# Patient Record
Sex: Female | Born: 1950 | Race: White | Hispanic: No | Marital: Married | Smoking: Former smoker
Health system: Southern US, Community
[De-identification: ages and names within clinical notes are randomized; demographics above are authoritative.]

## PROBLEM LIST (undated history)

## (undated) DIAGNOSIS — K219 Gastro-esophageal reflux disease without esophagitis: Secondary | ICD-10-CM

## (undated) DIAGNOSIS — M8580 Other specified disorders of bone density and structure, unspecified site: Secondary | ICD-10-CM

## (undated) DIAGNOSIS — I10 Essential (primary) hypertension: Secondary | ICD-10-CM

## (undated) DIAGNOSIS — R319 Hematuria, unspecified: Secondary | ICD-10-CM

## (undated) DIAGNOSIS — N809 Endometriosis, unspecified: Secondary | ICD-10-CM

## (undated) DIAGNOSIS — R569 Unspecified convulsions: Secondary | ICD-10-CM

## (undated) DIAGNOSIS — G510 Bell's palsy: Secondary | ICD-10-CM

## (undated) DIAGNOSIS — N840 Polyp of corpus uteri: Secondary | ICD-10-CM

## (undated) DIAGNOSIS — D1771 Benign lipomatous neoplasm of kidney: Secondary | ICD-10-CM

## (undated) DIAGNOSIS — D649 Anemia, unspecified: Secondary | ICD-10-CM

## (undated) HISTORY — PX: BREAST CYST EXCISION: SHX579

## (undated) HISTORY — PX: DILATION AND CURETTAGE OF UTERUS: SHX78

## (undated) HISTORY — PX: APPENDECTOMY: SHX54

## (undated) HISTORY — DX: Essential (primary) hypertension: I10

## (undated) HISTORY — PX: UTERINE FIBROID SURGERY: SHX826

## (undated) HISTORY — DX: Benign lipomatous neoplasm of kidney: D17.71

## (undated) HISTORY — DX: Other specified disorders of bone density and structure, unspecified site: M85.80

## (undated) HISTORY — PX: NOSE SURGERY: SHX723

## (undated) HISTORY — PX: BREAST SURGERY: SHX581

---

## 1998-07-16 ENCOUNTER — Other Ambulatory Visit: Admission: RE | Admit: 1998-07-16 | Discharge: 1998-07-16 | Payer: Self-pay | Admitting: Obstetrics and Gynecology

## 1998-09-22 ENCOUNTER — Ambulatory Visit (HOSPITAL_COMMUNITY): Admission: RE | Admit: 1998-09-22 | Discharge: 1998-09-22 | Payer: Self-pay | Admitting: *Deleted

## 2000-02-24 ENCOUNTER — Other Ambulatory Visit: Admission: RE | Admit: 2000-02-24 | Discharge: 2000-02-24 | Payer: Self-pay | Admitting: Obstetrics and Gynecology

## 2000-05-20 ENCOUNTER — Ambulatory Visit (HOSPITAL_COMMUNITY): Admission: RE | Admit: 2000-05-20 | Discharge: 2000-05-20 | Payer: Self-pay | Admitting: *Deleted

## 2002-08-09 ENCOUNTER — Ambulatory Visit: Admission: RE | Admit: 2002-08-09 | Discharge: 2002-08-09 | Payer: Self-pay | Admitting: Family Medicine

## 2002-08-09 ENCOUNTER — Encounter: Payer: Self-pay | Admitting: Family Medicine

## 2005-03-02 ENCOUNTER — Other Ambulatory Visit: Admission: RE | Admit: 2005-03-02 | Discharge: 2005-03-02 | Payer: Self-pay | Admitting: Obstetrics and Gynecology

## 2006-11-10 ENCOUNTER — Ambulatory Visit (HOSPITAL_COMMUNITY): Admission: RE | Admit: 2006-11-10 | Discharge: 2006-11-10 | Payer: Self-pay | Admitting: Obstetrics and Gynecology

## 2010-06-19 LAB — HM MAMMOGRAPHY: HM Mammogram: NEGATIVE

## 2013-09-13 ENCOUNTER — Encounter (HOSPITAL_BASED_OUTPATIENT_CLINIC_OR_DEPARTMENT_OTHER): Payer: Self-pay | Admitting: Emergency Medicine

## 2013-09-13 ENCOUNTER — Emergency Department (HOSPITAL_BASED_OUTPATIENT_CLINIC_OR_DEPARTMENT_OTHER): Payer: BC Managed Care – PPO

## 2013-09-13 ENCOUNTER — Emergency Department (HOSPITAL_BASED_OUTPATIENT_CLINIC_OR_DEPARTMENT_OTHER)
Admission: EM | Admit: 2013-09-13 | Discharge: 2013-09-13 | Disposition: A | Discharge status: Home / Self Care | Payer: BC Managed Care – PPO | Attending: Emergency Medicine | Admitting: Emergency Medicine

## 2013-09-13 DIAGNOSIS — Z8742 Personal history of other diseases of the female genital tract: Secondary | ICD-10-CM | POA: Insufficient documentation

## 2013-09-13 DIAGNOSIS — S0081XA Abrasion of other part of head, initial encounter: Secondary | ICD-10-CM

## 2013-09-13 DIAGNOSIS — S92102A Unspecified fracture of left talus, initial encounter for closed fracture: Secondary | ICD-10-CM

## 2013-09-13 DIAGNOSIS — S82899A Other fracture of unspecified lower leg, initial encounter for closed fracture: Secondary | ICD-10-CM | POA: Diagnosis not present

## 2013-09-13 DIAGNOSIS — Y9389 Activity, other specified: Secondary | ICD-10-CM | POA: Diagnosis not present

## 2013-09-13 DIAGNOSIS — IMO0002 Reserved for concepts with insufficient information to code with codable children: Secondary | ICD-10-CM | POA: Diagnosis not present

## 2013-09-13 DIAGNOSIS — S8001XA Contusion of right knee, initial encounter: Secondary | ICD-10-CM

## 2013-09-13 DIAGNOSIS — S8002XA Contusion of left knee, initial encounter: Secondary | ICD-10-CM

## 2013-09-13 DIAGNOSIS — Z87891 Personal history of nicotine dependence: Secondary | ICD-10-CM | POA: Diagnosis not present

## 2013-09-13 DIAGNOSIS — Y92410 Unspecified street and highway as the place of occurrence of the external cause: Secondary | ICD-10-CM | POA: Insufficient documentation

## 2013-09-13 DIAGNOSIS — S8000XA Contusion of unspecified knee, initial encounter: Secondary | ICD-10-CM | POA: Insufficient documentation

## 2013-09-13 DIAGNOSIS — S298XXA Other specified injuries of thorax, initial encounter: Secondary | ICD-10-CM | POA: Insufficient documentation

## 2013-09-13 DIAGNOSIS — S8990XA Unspecified injury of unspecified lower leg, initial encounter: Secondary | ICD-10-CM | POA: Diagnosis present

## 2013-09-13 DIAGNOSIS — S0003XA Contusion of scalp, initial encounter: Secondary | ICD-10-CM | POA: Insufficient documentation

## 2013-09-13 DIAGNOSIS — S0083XA Contusion of other part of head, initial encounter: Secondary | ICD-10-CM

## 2013-09-13 DIAGNOSIS — S82891A Other fracture of right lower leg, initial encounter for closed fracture: Secondary | ICD-10-CM

## 2013-09-13 HISTORY — DX: Endometriosis, unspecified: N80.9

## 2013-09-13 HISTORY — DX: Polyp of corpus uteri: N84.0

## 2013-09-13 MED ORDER — HYDROCODONE-ACETAMINOPHEN 5-325 MG PO TABS: 2.0000 | ORAL_TABLET | ORAL | Status: DC | PRN

## 2013-09-13 MED ORDER — IBUPROFEN 800 MG PO TABS: 800.0000 mg | ORAL_TABLET | Freq: Three times a day (TID) | ORAL | Status: DC

## 2013-09-13 MED ORDER — IBUPROFEN 800 MG PO TABS
800.0000 mg | ORAL_TABLET | Freq: Once | ORAL | Status: AC
  Administered 2013-09-13: 800 mg via ORAL
  Filled 2013-09-13: qty 1
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
## 2013-09-14 NOTE — ED Provider Notes (Signed)
Medical screening examination/treatment/procedure(s) were performed by non-physician practitioner and as supervising physician I was immediately available for consultation/collaboration.  EKG Interpretation   None         Ziyana Morikawa T Nyellie Yetter, MD 09/14/13 0058 

## 2014-06-13 ENCOUNTER — Other Ambulatory Visit: Payer: Self-pay | Admitting: Urology

## 2014-07-08 ENCOUNTER — Encounter (HOSPITAL_COMMUNITY): Payer: Self-pay | Admitting: Pharmacy Technician

## 2014-07-10 NOTE — Patient Instructions (Addendum)
Denise Lin  07/10/2014   Your procedure is scheduled on:  07/19/2014    Report to [facility].  Follow the Signs to [facility] at 1100       am  Call this number if you have problems the morning of surgery: [phone]   Remember: Magnesium Citrate - 1 bottle - Take 8-10 ounces by 12 noon day before procedure.    Do not eat food or drink liquids after midnight.   Take these medicines the morning of surgery with A SIP OF WATER: none   Do not wear jewelry, make-up or nail polish.  Do not wear lotions, powders, or perfumes. , deodorant.    Do not shave 48 hours prior to surgery.   Do not bring valuables to the hospital.  Contacts, dentures or bridgework may not be worn into surgery.  Leave suitcase in the car. After surgery it may be brought to your room.  For patients admitted to the hospital, checkout time is 11:00 AM the day of  discharge.        Please read over the following fact sheets that you were given: [facility] - Preparing for Surgery Before surgery, you can play an important role.  Because skin is not sterile, your skin needs to be as free of germs as possible.  You can reduce the number of germs on your skin by washing with CHG (chlorahexidine gluconate) soap before surgery.  CHG is an antiseptic cleaner which kills germs and bonds with the skin to continue killing germs even after washing. Please DO NOT use if you have an allergy to CHG or antibacterial soaps.  If your skin becomes reddened/irritated stop using the CHG and inform your nurse when you arrive at Short Stay. Do not shave (including legs and underarms) for at least 48 hours prior to the first CHG shower.  You may shave your face/neck. Please follow these instructions carefully:  1.  Shower with CHG Soap the night before surgery and the  morning of Surgery.  2.  If you choose to wash your hair, wash your hair first as usual with your  normal  shampoo.  3.  After you shampoo, rinse your hair and  body thoroughly to remove the  shampoo.                           4.  Use CHG as you would any other liquid soap.  You can apply chg directly  to the skin and wash                       Gently with a scrungie or clean washcloth.  5.  Apply the CHG Soap to your body ONLY FROM THE NECK DOWN.   Do not use on face/ open                           Wound or open sores. Avoid contact with eyes, ears mouth and genitals (private parts).                       Wash face,  Genitals (private parts) with your normal soap.             6.  Wash thoroughly, paying special attention to the area where your surgery  will be performed.  7.  Thoroughly rinse your body  with warm water from the neck down.  8.  DO NOT shower/wash with your normal soap after using and rinsing off  the CHG Soap.                9.  Pat yourself dry with a clean towel.            10.  Wear clean pajamas.            11.  Place clean sheets on your bed the night of your first shower and do not  sleep with pets. Day of Surgery : Do not apply any lotions/deodorants the morning of surgery.  Please wear clean clothes to the hospital/surgery center.  FAILURE TO FOLLOW THESE INSTRUCTIONS MAY RESULT IN THE CANCELLATION OF YOUR SURGERY PATIENT SIGNATURE_________________________________  NURSE SIGNATURE__________________________________  ________________________________________________________________________  WHAT IS A BLOOD TRANSFUSION? Blood Transfusion Information  A transfusion is the replacement of blood or some of its parts. Blood is made up of multiple cells which provide different functions.  Red blood cells carry oxygen and are used for blood loss replacement.  White blood cells fight against infection.  Platelets control bleeding.  Plasma helps clot blood.  Other blood products are available for specialized needs, such as hemophilia or other clotting disorders. BEFORE THE TRANSFUSION  Who gives blood for transfusions?    Healthy volunteers who are fully evaluated to make sure their blood is safe. This is blood bank blood. Transfusion therapy is the safest it has ever been in the practice of medicine. Before blood is taken from a donor, a complete history is taken to make sure that person has no history of diseases nor engages in risky social behavior (examples are intravenous drug use or sexual activity with multiple partners). The donor's travel history is screened to minimize risk of transmitting infections, such as malaria. The donated blood is tested for signs of infectious diseases, such as HIV and hepatitis. The blood is then tested to be sure it is compatible with you in order to minimize the chance of a transfusion reaction. If you or a relative donates blood, this is often done in anticipation of surgery and is not appropriate for emergency situations. It takes many days to process the donated blood. RISKS AND COMPLICATIONS Although transfusion therapy is very safe and saves many lives, the main dangers of transfusion include:   Getting an infectious disease.  Developing a transfusion reaction. This is an allergic reaction to something in the blood you were given. Every precaution is taken to prevent this. The decision to have a blood transfusion has been considered carefully by your caregiver before blood is given. Blood is not given unless the benefits outweigh the risks. AFTER THE TRANSFUSION  Right after receiving a blood transfusion, you will usually feel much better and more energetic. This is especially true if your red blood cells have gotten low (anemic). The transfusion raises the level of the red blood cells which carry oxygen, and this usually causes an energy increase.  The nurse administering the transfusion will monitor you carefully for complications. HOME CARE INSTRUCTIONS  No special instructions are needed after a transfusion. You may find your energy is better. Speak with your  caregiver about any limitations on activity for underlying diseases you may have. SEEK MEDICAL CARE IF:   Your condition is not improving after your transfusion.  You develop redness or irritation at the intravenous (IV) site. SEEK IMMEDIATE MEDICAL CARE IF:  Any of the following symptoms occur  over the next 12 hours:  Shaking chills.  You have a temperature by mouth above 102 F (38.9 C), not controlled by medicine.  Chest, back, or muscle pain.  People around you feel you are not acting correctly or are confused.  Shortness of breath or difficulty breathing.  Dizziness and fainting.  You get a rash or develop hives.  You have a decrease in urine output.  Your urine turns a dark color or changes to pink, red, or brown. Any of the following symptoms occur over the next 10 days:  You have a temperature by mouth above 102 F (38.9 C), not controlled by medicine.  Shortness of breath.  Weakness after normal activity.  The white part of the eye turns yellow (jaundice).  You have a decrease in the amount of urine or are urinating less often.  Your urine turns a dark color or changes to pink, red, or brown. Document Released: 10/22/2000 Document Revised: 01/17/2012 Document Reviewed: 06/10/2008 ExitCare Patient Information 2014 [place].  _______________________________________________________________________, coughing and deep breathing exercises, leg exercises

## 2014-07-11 ENCOUNTER — Encounter (INDEPENDENT_AMBULATORY_CARE_PROVIDER_SITE_OTHER): Payer: Self-pay

## 2014-07-11 ENCOUNTER — Encounter (HOSPITAL_COMMUNITY)
Admission: RE | Admit: 2014-07-11 | Discharge: 2014-07-11 | Disposition: A | Discharge status: Home / Self Care | Payer: BC Managed Care – PPO | Source: Ambulatory Visit | Attending: Urology | Admitting: Urology

## 2014-07-11 ENCOUNTER — Encounter (HOSPITAL_COMMUNITY): Payer: Self-pay

## 2014-07-11 DIAGNOSIS — Z01818 Encounter for other preprocedural examination: Secondary | ICD-10-CM | POA: Insufficient documentation

## 2014-07-11 DIAGNOSIS — N289 Disorder of kidney and ureter, unspecified: Secondary | ICD-10-CM | POA: Insufficient documentation

## 2014-07-11 HISTORY — DX: Unspecified convulsions: R56.9

## 2014-07-11 HISTORY — DX: Hematuria, unspecified: R31.9

## 2014-07-11 HISTORY — DX: Anemia, unspecified: D64.9

## 2014-07-11 HISTORY — DX: Bell's palsy: G51.0

## 2014-07-11 HISTORY — DX: Gastro-esophageal reflux disease without esophagitis: K21.9

## 2014-07-11 LAB — BASIC METABOLIC PANEL
ANION GAP: 13 (ref 5–15)
BUN: 13 mg/dL (ref 6–23)
CALCIUM: 10.1 mg/dL (ref 8.4–10.5)
CO2: 27 mEq/L (ref 19–32)
Chloride: 103 mEq/L (ref 96–112)
Creatinine, Ser: 0.6 mg/dL (ref 0.50–1.10)
GFR calc Af Amer: >90 mL/min (ref >90)
GFR calc non Af Amer: >90 mL/min (ref >90)
Glucose, Bld: 101 mg/dL — ABNORMAL HIGH (ref 70–99)
Potassium: 4.7 mEq/L (ref 3.7–5.3)
Sodium: 143 mEq/L (ref 137–147)

## 2014-07-11 LAB — CBC
HCT: 40.2 % (ref 36.0–46.0)
Hemoglobin: 13.1 g/dL (ref 12.0–15.0)
MCH: 28.4 pg (ref 26.0–34.0)
MCHC: 32.6 g/dL (ref 30.0–36.0)
MCV: 87.2 fL (ref 78.0–100.0)
PLATELETS: 291 10*3/uL (ref 150–400)
RBC: 4.61 MIL/uL (ref 3.87–5.11)
RDW: 13.7 % (ref 11.5–15.5)
WBC: 6.3 10*3/uL (ref 4.0–10.5)

## 2014-07-11 LAB — ABO/RH: ABO/RH(D): A POS

## 2014-07-11 NOTE — Progress Notes (Signed)
Last office visit with Dr Jerilynn Birkenhead- 02/26/14 on chart

## 2014-07-11 NOTE — Progress Notes (Signed)
Patient states she was aware of Magnesium Citrate as preop prep.

## 2014-07-19 ENCOUNTER — Encounter (HOSPITAL_COMMUNITY)
Admission: RE | Disposition: A | Discharge status: Home / Self Care | Payer: Self-pay | Source: Ambulatory Visit | Attending: Urology

## 2014-07-19 ENCOUNTER — Encounter (HOSPITAL_COMMUNITY): Payer: BC Managed Care – PPO | Admitting: Registered Nurse

## 2014-07-19 ENCOUNTER — Inpatient Hospital Stay (HOSPITAL_COMMUNITY): Payer: BC Managed Care – PPO | Admitting: Registered Nurse

## 2014-07-19 ENCOUNTER — Encounter (HOSPITAL_COMMUNITY): Payer: Self-pay | Admitting: Registered Nurse

## 2014-07-19 ENCOUNTER — Inpatient Hospital Stay (HOSPITAL_COMMUNITY)
Admission: RE | Admit: 2014-07-19 | Discharge: 2014-07-21 | DRG: 658 | Disposition: A | Discharge status: Home / Self Care | Payer: BC Managed Care – PPO | Source: Ambulatory Visit | Attending: Urology | Admitting: Urology

## 2014-07-19 DIAGNOSIS — N289 Disorder of kidney and ureter, unspecified: Secondary | ICD-10-CM | POA: Diagnosis present

## 2014-07-19 DIAGNOSIS — K5730 Diverticulosis of large intestine without perforation or abscess without bleeding: Secondary | ICD-10-CM | POA: Diagnosis present

## 2014-07-19 DIAGNOSIS — K219 Gastro-esophageal reflux disease without esophagitis: Secondary | ICD-10-CM | POA: Diagnosis present

## 2014-07-19 DIAGNOSIS — Z01812 Encounter for preprocedural laboratory examination: Secondary | ICD-10-CM | POA: Diagnosis not present

## 2014-07-19 DIAGNOSIS — K660 Peritoneal adhesions (postprocedural) (postinfection): Secondary | ICD-10-CM | POA: Diagnosis present

## 2014-07-19 DIAGNOSIS — Z87891 Personal history of nicotine dependence: Secondary | ICD-10-CM

## 2014-07-19 DIAGNOSIS — N2889 Other specified disorders of kidney and ureter: Secondary | ICD-10-CM | POA: Diagnosis present

## 2014-07-19 DIAGNOSIS — D3000 Benign neoplasm of unspecified kidney: Principal | ICD-10-CM | POA: Diagnosis present

## 2014-07-19 DIAGNOSIS — G43909 Migraine, unspecified, not intractable, without status migrainosus: Secondary | ICD-10-CM | POA: Diagnosis present

## 2014-07-19 HISTORY — PX: ROBOTIC ASSITED PARTIAL NEPHRECTOMY: SHX6087

## 2014-07-19 LAB — TYPE AND SCREEN
ABO/RH(D): A POS
Antibody Screen: NEGATIVE

## 2014-07-19 LAB — HEMOGLOBIN AND HEMATOCRIT, BLOOD
HCT: 35.9 % — ABNORMAL LOW (ref 36.0–46.0)
HEMOGLOBIN: 11.9 g/dL — AB (ref 12.0–15.0)

## 2014-07-19 SURGERY — ROBOTIC ASSITED PARTIAL NEPHRECTOMY
Anesthesia: General | Laterality: Left

## 2014-07-19 MED ORDER — GLYCOPYRROLATE 0.2 MG/ML IJ SOLN
INTRAMUSCULAR | Status: DC | PRN
  Administered 2014-07-19: .6 mg via INTRAVENOUS

## 2014-07-19 MED ORDER — LACTATED RINGERS IV SOLN
INTRAVENOUS | Status: DC
  Administered 2014-07-20: 02:00:00 via INTRAVENOUS

## 2014-07-19 MED ORDER — MIDAZOLAM HCL 2 MG/2ML IJ SOLN
INTRAMUSCULAR | Status: AC
  Filled 2014-07-19: qty 2

## 2014-07-19 MED ORDER — LIDOCAINE HCL (CARDIAC) 20 MG/ML IV SOLN
INTRAVENOUS | Status: DC | PRN
  Administered 2014-07-19: 25 mg via INTRATRACHEAL
  Administered 2014-07-19: 75 mg via INTRAVENOUS

## 2014-07-19 MED ORDER — LABETALOL HCL 5 MG/ML IV SOLN
INTRAVENOUS | Status: DC | PRN
  Administered 2014-07-19: 5 mg via INTRAVENOUS
  Administered 2014-07-19: 2.5 mg via INTRAVENOUS

## 2014-07-19 MED ORDER — PROPOFOL 10 MG/ML IV BOLUS
INTRAVENOUS | Status: DC | PRN
  Administered 2014-07-19 (×2): 30 mg via INTRAVENOUS
  Administered 2014-07-19: 180 mg via INTRAVENOUS

## 2014-07-19 MED ORDER — ACETAMINOPHEN 10 MG/ML IV SOLN
1000.0000 mg | Freq: Once | INTRAVENOUS | Status: AC
  Administered 2014-07-19: 1000 mg via INTRAVENOUS
  Filled 2014-07-19: qty 100

## 2014-07-19 MED ORDER — HYDROMORPHONE HCL PF 2 MG/ML IJ SOLN
INTRAMUSCULAR | Status: AC
  Filled 2014-07-19: qty 1

## 2014-07-19 MED ORDER — MANNITOL 25 % IV SOLN
INTRAVENOUS | Status: DC | PRN
  Administered 2014-07-19 (×2): 12.5 g via INTRAVENOUS

## 2014-07-19 MED ORDER — BUPIVACAINE LIPOSOME 1.3 % IJ SUSP
20.0000 mL | Freq: Once | INTRAMUSCULAR | Status: AC
Start: 2014-07-19 — End: 2014-07-19
  Administered 2014-07-19: 20 mL
  Filled 2014-07-19: qty 20

## 2014-07-19 MED ORDER — GLYCOPYRROLATE 0.2 MG/ML IJ SOLN
INTRAMUSCULAR | Status: AC
  Filled 2014-07-19: qty 3

## 2014-07-19 MED ORDER — NEOSTIGMINE METHYLSULFATE 10 MG/10ML IV SOLN
INTRAVENOUS | Status: AC
  Filled 2014-07-19: qty 1

## 2014-07-19 MED ORDER — SODIUM CHLORIDE 0.9 % IJ SOLN
INTRAMUSCULAR | Status: AC
  Filled 2014-07-19: qty 10

## 2014-07-19 MED ORDER — ACETAMINOPHEN 500 MG PO TABS
1000.0000 mg | ORAL_TABLET | Freq: Four times a day (QID) | ORAL | Status: AC
  Administered 2014-07-19 – 2014-07-20 (×3): 1000 mg via ORAL
  Filled 2014-07-19 (×3): qty 2

## 2014-07-19 MED ORDER — SUFENTANIL CITRATE 50 MCG/ML IV SOLN
INTRAVENOUS | Status: AC
  Filled 2014-07-19: qty 1

## 2014-07-19 MED ORDER — ROCURONIUM BROMIDE 100 MG/10ML IV SOLN
INTRAVENOUS | Status: AC
  Filled 2014-07-19: qty 1

## 2014-07-19 MED ORDER — LACTATED RINGERS IV SOLN
INTRAVENOUS | Status: DC
  Administered 2014-07-19: 15:00:00 via INTRAVENOUS
  Administered 2014-07-19: 1000 mL via INTRAVENOUS

## 2014-07-19 MED ORDER — DEXAMETHASONE SODIUM PHOSPHATE 10 MG/ML IJ SOLN
INTRAMUSCULAR | Status: AC
  Filled 2014-07-19: qty 1

## 2014-07-19 MED ORDER — ROCURONIUM BROMIDE 100 MG/10ML IV SOLN
INTRAVENOUS | Status: DC | PRN
  Administered 2014-07-19: 60 mg via INTRAVENOUS
  Administered 2014-07-19: 10 mg via INTRAVENOUS

## 2014-07-19 MED ORDER — CEFAZOLIN SODIUM-DEXTROSE 2-3 GM-% IV SOLR
2.0000 g | INTRAVENOUS | Status: AC
  Administered 2014-07-19: 2 g via INTRAVENOUS

## 2014-07-19 MED ORDER — HYDROMORPHONE HCL PF 1 MG/ML IJ SOLN: 0.5000 mg | INTRAMUSCULAR | Status: DC | PRN

## 2014-07-19 MED ORDER — HYDROCODONE-ACETAMINOPHEN 5-325 MG PO TABS: 1.0000 | ORAL_TABLET | Freq: Four times a day (QID) | ORAL | Status: DC | PRN

## 2014-07-19 MED ORDER — DEXAMETHASONE SODIUM PHOSPHATE 10 MG/ML IJ SOLN
INTRAMUSCULAR | Status: DC | PRN
  Administered 2014-07-19: 10 mg via INTRAVENOUS

## 2014-07-19 MED ORDER — LIDOCAINE HCL (CARDIAC) 20 MG/ML IV SOLN
INTRAVENOUS | Status: AC
  Filled 2014-07-19: qty 5

## 2014-07-19 MED ORDER — ONDANSETRON HCL 4 MG/2ML IJ SOLN
INTRAMUSCULAR | Status: AC
  Filled 2014-07-19: qty 2

## 2014-07-19 MED ORDER — MANNITOL 25 % IV SOLN
25.0000 g | Freq: Once | INTRAVENOUS | Status: DC
  Filled 2014-07-19 (×2): qty 100

## 2014-07-19 MED ORDER — MIDAZOLAM HCL 5 MG/5ML IJ SOLN
INTRAMUSCULAR | Status: DC | PRN
  Administered 2014-07-19: 2 mg via INTRAVENOUS

## 2014-07-19 MED ORDER — SODIUM CHLORIDE 0.9 % IJ SOLN
INTRAMUSCULAR | Status: AC
  Filled 2014-07-19: qty 20

## 2014-07-19 MED ORDER — ONDANSETRON HCL 4 MG/2ML IJ SOLN
INTRAMUSCULAR | Status: DC | PRN
  Administered 2014-07-19: 4 mg via INTRAVENOUS

## 2014-07-19 MED ORDER — SCOPOLAMINE 1 MG/3DAYS TD PT72
MEDICATED_PATCH | TRANSDERMAL | Status: DC | PRN
  Administered 2014-07-19: 1 via TRANSDERMAL

## 2014-07-19 MED ORDER — PHENYLEPHRINE HCL 10 MG/ML IJ SOLN
INTRAMUSCULAR | Status: DC | PRN
  Administered 2014-07-19: 120 ug via INTRAVENOUS
  Administered 2014-07-19: 80 ug via INTRAVENOUS

## 2014-07-19 MED ORDER — NEOSTIGMINE METHYLSULFATE 10 MG/10ML IV SOLN
INTRAVENOUS | Status: DC | PRN
  Administered 2014-07-19: 4 mg via INTRAVENOUS

## 2014-07-19 MED ORDER — METOCLOPRAMIDE HCL 5 MG/ML IJ SOLN
INTRAMUSCULAR | Status: DC | PRN
  Administered 2014-07-19: 10 mg via INTRAVENOUS

## 2014-07-19 MED ORDER — LABETALOL HCL 5 MG/ML IV SOLN
INTRAVENOUS | Status: AC
  Filled 2014-07-19: qty 4

## 2014-07-19 MED ORDER — HEMOSTATIC AGENTS (NO CHARGE) OPTIME
TOPICAL | Status: DC | PRN
  Administered 2014-07-19: 1 via TOPICAL

## 2014-07-19 MED ORDER — HYDROMORPHONE HCL PF 1 MG/ML IJ SOLN
INTRAMUSCULAR | Status: DC | PRN
  Administered 2014-07-19: 1 mg via INTRAVENOUS

## 2014-07-19 MED ORDER — SUFENTANIL CITRATE 50 MCG/ML IV SOLN
INTRAVENOUS | Status: DC | PRN
  Administered 2014-07-19: 15 ug via INTRAVENOUS
  Administered 2014-07-19: 10 ug via INTRAVENOUS
  Administered 2014-07-19: 15 ug via INTRAVENOUS
  Administered 2014-07-19: 5 ug via INTRAVENOUS
  Administered 2014-07-19 (×2): 10 ug via INTRAVENOUS

## 2014-07-19 MED ORDER — SCOPOLAMINE 1 MG/3DAYS TD PT72
MEDICATED_PATCH | TRANSDERMAL | Status: AC
  Filled 2014-07-19: qty 1

## 2014-07-19 MED ORDER — OXYCODONE HCL 5 MG PO TABS
5.0000 mg | ORAL_TABLET | ORAL | Status: DC | PRN
  Administered 2014-07-21: 5 mg via ORAL
  Filled 2014-07-19: qty 1

## 2014-07-19 MED ORDER — PROPOFOL 10 MG/ML IV BOLUS
INTRAVENOUS | Status: AC
  Filled 2014-07-19: qty 20

## 2014-07-19 MED ORDER — CEFAZOLIN SODIUM-DEXTROSE 2-3 GM-% IV SOLR
INTRAVENOUS | Status: AC
  Filled 2014-07-19: qty 50

## 2014-07-19 MED ORDER — SUCCINYLCHOLINE CHLORIDE 20 MG/ML IJ SOLN
INTRAMUSCULAR | Status: DC | PRN
  Administered 2014-07-19: 20 mg via INTRAVENOUS
  Administered 2014-07-19: 100 mg via INTRAVENOUS
  Administered 2014-07-19: 20 mg via INTRAVENOUS

## 2014-07-19 SURGICAL SUPPLY — 66 items
ADH SKN CLS APL DERMABOND .7 (GAUZE/BANDAGES/DRESSINGS) ×1
APL ESCP 34 STRL LF DISP (HEMOSTASIS) ×1
APPLICATOR SURGIFLO ENDO (HEMOSTASIS) ×2 IMPLANT
BAG SPEC RTRVL LRG 6X4 10 (ENDOMECHANICALS) ×1
CABLE HIGH FREQUENCY MONO STRZ (ELECTRODE) ×2 IMPLANT
CHLORAPREP W/TINT 26ML (MISCELLANEOUS) ×2 IMPLANT
CLIP LIGATING HEM O LOK PURPLE (MISCELLANEOUS) IMPLANT
CLIP LIGATING HEMO LOK XL GOLD (MISCELLANEOUS) ×4 IMPLANT
CLIP LIGATING HEMO O LOK GREEN (MISCELLANEOUS) ×2 IMPLANT
CORDS BIPOLAR (ELECTRODE) ×2 IMPLANT
COVER SURGICAL LIGHT HANDLE (MISCELLANEOUS) ×2 IMPLANT
COVER TIP SHEARS 8 DVNC (MISCELLANEOUS) ×1 IMPLANT
COVER TIP SHEARS 8MM DA VINCI (MISCELLANEOUS) ×1
CUTTER FLEX LINEAR 45M (STAPLE) ×2 IMPLANT
DECANTER SPIKE VIAL GLASS SM (MISCELLANEOUS) ×2 IMPLANT
DERMABOND ADVANCED (GAUZE/BANDAGES/DRESSINGS) ×1
DERMABOND ADVANCED .7 DNX12 (GAUZE/BANDAGES/DRESSINGS) ×1 IMPLANT
DRAIN CHANNEL 15F RND FF 3/16 (WOUND CARE) ×2 IMPLANT
DRAPE INCISE IOBAN 66X45 STRL (DRAPES) ×2 IMPLANT
DRAPE LAPAROSCOPIC ABDOMINAL (DRAPES) ×2 IMPLANT
DRAPE LG THREE QUARTER DISP (DRAPES) ×4 IMPLANT
DRAPE TABLE BACK 44X90 PK DISP (DRAPES) ×2 IMPLANT
DRAPE WARM FLUID 44X44 (DRAPE) ×2 IMPLANT
ELECT REM PT RETURN 9FT ADLT (ELECTROSURGICAL) ×4
ELECTRODE REM PT RTRN 9FT ADLT (ELECTROSURGICAL) ×2 IMPLANT
EVACUATOR SILICONE 100CC (DRAIN) ×2 IMPLANT
GLOVE BIO SURGEON STRL SZ 6.5 (GLOVE) ×2 IMPLANT
GLOVE BIOGEL M STRL SZ7.5 (GLOVE) ×4 IMPLANT
GOWN STRL REUS W/TWL LRG LVL3 (GOWN DISPOSABLE) ×6 IMPLANT
GOWN STRL REUS W/TWL XL LVL3 (GOWN DISPOSABLE) ×2 IMPLANT
KIT ACCESSORY DA VINCI DISP (KITS) ×1
KIT ACCESSORY DVNC DISP (KITS) ×1 IMPLANT
KIT BASIN OR (CUSTOM PROCEDURE TRAY) ×2 IMPLANT
LOOP VESSEL MAXI BLUE (MISCELLANEOUS) ×2 IMPLANT
NDL INSUFFLATION 14GA 120MM (NEEDLE) ×1 IMPLANT
NEEDLE INSUFFLATION 14GA 120MM (NEEDLE) ×2 IMPLANT
NS IRRIG 1000ML POUR BTL (IV SOLUTION) ×2 IMPLANT
PENCIL BUTTON HOLSTER BLD 10FT (ELECTRODE) ×2 IMPLANT
POSITIONER SURGICAL ARM (MISCELLANEOUS) ×4 IMPLANT
POUCH SPECIMEN RETRIEVAL 10MM (ENDOMECHANICALS) ×2 IMPLANT
RELOAD 45 VASCULAR/THIN (ENDOMECHANICALS) ×2 IMPLANT
RELOAD STAPLE 45 2.5 WHT GRN (ENDOMECHANICALS) ×3 IMPLANT
SET TUBE IRRIG SUCTION NO TIP (IRRIGATION / IRRIGATOR) ×1 IMPLANT
SOLUTION ANTI FOG 6CC (MISCELLANEOUS) ×2 IMPLANT
SOLUTION ELECTROLUBE (MISCELLANEOUS) ×2 IMPLANT
SPONGE LAP 4X18 X RAY DECT (DISPOSABLE) ×2 IMPLANT
SURGIFLO W/THROMBIN 8M KIT (HEMOSTASIS) ×2 IMPLANT
SUT ETHILON 3 0 PS 1 (SUTURE) ×2 IMPLANT
SUT MNCRL AB 4-0 PS2 18 (SUTURE) ×4 IMPLANT
SUT PDS AB 1 CT1 27 (SUTURE) ×2 IMPLANT
SUT V-LOC BARB 180 2/0GR6 GS22 (SUTURE) ×2
SUT V-LOC BARB 180 2/0GR9 GS23 (SUTURE)
SUT VLOC BARB 180 ABS3/0GR12 (SUTURE) ×2
SUTURE V-LC BRB 180 2/0GR6GS22 (SUTURE) ×1 IMPLANT
SUTURE V-LC BRB 180 2/0GR9GS23 (SUTURE) IMPLANT
SUTURE VLOC BRB 180 ABS3/0GR12 (SUTURE) ×1 IMPLANT
SYR BULB IRRIGATION 50ML (SYRINGE) IMPLANT
TOWEL OR 17X26 10 PK STRL BLUE (TOWEL DISPOSABLE) ×4 IMPLANT
TOWEL OR NON WOVEN STRL DISP B (DISPOSABLE) ×4 IMPLANT
TRAY FOLEY CATH 14FRSI W/METER (CATHETERS) ×2 IMPLANT
TRAY LAP CHOLE (CUSTOM PROCEDURE TRAY) ×2 IMPLANT
TROCAR 12M 150ML BLUNT (TROCAR) ×2 IMPLANT
TROCAR XCEL 12X100 BLDLESS (ENDOMECHANICALS) ×3 IMPLANT
TROCAR XCEL NON-BLD 5MMX100MML (ENDOMECHANICALS) ×2 IMPLANT
TUBING INSUFFLATION 10FT LAP (TUBING) ×2 IMPLANT
WATER STERILE IRR 1500ML POUR (IV SOLUTION) ×4 IMPLANT

## 2014-07-19 NOTE — H&P (Signed)
Denise Lin is an 63 y.o. female.    Chief Complaint: Pre Op Left Robotic Partial Nephrectomy  HPI:     1 - Left Renal Mass - Pt with left 1.5cm solid, enhancining, approx 80% exophytic solid mass (lateral mid) found on eval microhematuira 2015 by Dr. Jeffie Pollock. 1 artery, 1 vein renovascular anatomy. No contralateral or additional lesions.  2 - Microscopic Hematuria - Eval 04/2014 with CT, Cytology, Cysto with bilateral punctate renal stones and left mass as per above.  3 - Nephrolithiasis - bilateral punctate stones by CT 2015. No prior colic.   PMH sig for ENT surgery, appy. No CV disease. No strong blood thinners.  Today Denise Lin is seen to proceed with left robotic partial nephrectomy.   Past Medical History  Diagnosis Date  . Endometriosis   . Uterine polyp   . Headache(784.0)     hx of migraines   . Bell's palsy     recent   . GERD (gastroesophageal reflux disease)   . Hematuria     recent   . Seizures     as an infant no problems since   . Anemia     with endometriosis     Past Surgical History  Procedure Laterality Date  . Uterine fibroid surgery    . Nose surgery    . Appendectomy    . Dilation and curettage of uterus      x 2 for endomtriosis and polyp  . Breast surgery      benign tumor removed right breast     No family history on file. Social History:  reports that she has quit smoking. She has never used smokeless tobacco. She reports that she drinks alcohol. She reports that she does not use illicit drugs.  Allergies: No Known Allergies  No prescriptions prior to admission    No results found for this or any previous visit (from the past 48 hour(s)). No results found.  Review of Systems  Constitutional: Negative.  Negative for fever and chills.  HENT: Negative.   Eyes: Negative.   Respiratory: Negative.   Gastrointestinal: Negative.  Negative for vomiting.  Genitourinary: Negative.  Negative for hematuria and flank pain.  Musculoskeletal: Negative.    Skin: Negative.   Neurological: Negative.   Endo/Heme/Allergies: Negative.   Psychiatric/Behavioral: Negative.     There were no vitals taken for this visit. Physical Exam  Constitutional: She is oriented to person, place, and time. She appears well-developed.  HENT:  Head: Normocephalic.  Eyes: Pupils are equal, round, and reactive to light.  Neck: Normal range of motion. Neck supple.  Cardiovascular: Normal rate.   Respiratory: Effort normal.  GI: Soft. Bowel sounds are normal.  Genitourinary:  No CVAT  Musculoskeletal: Normal range of motion.  Neurological: She is alert and oriented to person, place, and time.  Skin: Skin is warm and dry.  Psychiatric: She has a normal mood and affect. Her behavior is normal. Judgment and thought content normal.     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  She is now decided upon partial nephrectomy. Given her excellent overall health, I feel this is good option.   We rediscussed the role of partial nephrectomy with the overall goals being a balance of trying to achieve complete surgical excision (negative margins) while minimizing loss of normally functioning kidney. We then rediscussed surgical approaches including robotic and open techniques with robotic associated with a  shorter convalescence. I showed the patient on their abdomen the approximately 4-6 incision (trocar) sites as well as presumed extraction sites with robotic approach as well as possible open incision sites. We specifically readdressed that there may be need to alter operative plans according to intraopertive findings including conversion to open procedure or conversion to radical nephrectomy as well as need for adjunctive procedures such as ureteral stenting to promote correct renal healing. We rediscussed specific peri-operative risks including bleeding, infection, deep vein thrombosis, pulmonary embolism,  compartment syndrome, neuropathy / neuropraxia, heart attack, stroke, death, as well as long-term risks such as non-cure / need for additional therapy and need for imaging and lab based post-op surveillance protocols. We rediscussed typical hospital course of approximately 2 day hospitalization, need for peri-operative drains / catheters, and typical post-hospital course with return to most non-strenuous activities by 2 weeks and ability to return to most jobs and more strenuous activity such as exercise by 6 weeks.    2 - Microscopic Hematuria - eval wtih small renal mass and punctate stones as likely etiology.  3 - Nephrolithiasis - no hydro, small, asymptomatic, observe.   Denise Lin 07/19/2014, 6:38 AM

## 2014-07-19 NOTE — Brief Op Note (Signed)
07/19/2014  3:19 PM  PATIENT:  Kandis Nab  63 y.o. female  PRE-OPERATIVE DIAGNOSIS:  left renal mass  POST-OPERATIVE DIAGNOSIS:  left renal mass  PROCEDURE:  Procedure(s): ROBOTIC ASSITED PARTIAL NEPHRECTOMY, LAPAROSCOPIC ADHESIOLYSIS (Left)  SURGEON:  Surgeon(s) and Role:    * Alexis Frock, MD - Primary  PHYSICIAN ASSISTANT:   ASSISTANTS: Debbrah Alar, PA   ANESTHESIA:   local and general  EBL:  Total I/O In: 2000 [I.V.:2000] Out: 450 [Urine:400; Blood:50]  BLOOD ADMINISTERED:none  DRAINS: JP to bulb suction, Foley to straight drain   LOCAL MEDICATIONS USED:  MARCAINE     SPECIMEN:  Source of Specimen:  left partial nephrectomy, fat over left renal mass  DISPOSITION OF SPECIMEN:  PATHOLOGY  COUNTS:  YES  TOURNIQUET:  * No tourniquets in log *  DICTATION: .Other Dictation: Dictation Number [id number]  PLAN OF CARE: Admit to inpatient   PATIENT DISPOSITION:  PACU - hemodynamically stable.   Delay start of Pharmacological VTE agent (>24hrs) due to surgical blood loss or risk of bleeding: yes

## 2014-07-19 NOTE — Discharge Instructions (Signed)
1. Activity:  You are encouraged to ambulate frequently (about every hour during waking hours) to help prevent blood clots from forming in your legs or lungs.  However, you should not engage in any heavy lifting (> 10-15 lbs), strenuous activity, or straining. 2. Diet: You should continue a clear liquid diet until passing gas from below.  Once this occurs, you may advance your diet to a soft diet that would be easy to digest (i.e soups, scrambled eggs, mashed potatoes, etc.) for 24 hours just as you would if getting over a bad stomach flu.  If tolerating this diet well for 24 hours, you may then begin eating regular food.  It will be normal to have some amount of bloating, nausea, and abdominal discomfort intermittently. 3. Prescriptions:  You will be provided a prescription for pain medication to take as needed.  If your pain is not severe enough to require the prescription pain medication, you may take Tylenol instead.  You should also take an over the counter stool softener (Colace 100 mg twice daily) to avoid straining with bowel movements as the pain medication may constipate you.  4. Incisions: You may remove your dressing bandages the 2nd day after surgery.  You most likely will have a few small staples in each of the incisions and once the bandages are removed, the incisions     may stay open to air.  You may start showering (not soaking or bathing in water) 48 hours after surgery and the incisions simply need to be patted dry after the shower.  No additional care is needed. 5.What to call us about: You should call the office [phone]) if you develop fever > 101, persistent vomiting, or the catheter stops draining. Also, feel free to call with any other questions you may have and remember the handout that was provided to you as a reference preoperatively which answers many of the common questions that arise after surgery. 6.You may resume aspirin, advil, aleve, vitamins, and supplements 7 days  after surgery.

## 2014-07-19 NOTE — Anesthesia Postprocedure Evaluation (Signed)
  Anesthesia Post-op Note  Patient: Denise Lin  Procedure(s) Performed: Procedure(s) (LRB): ROBOTIC ASSITED PARTIAL NEPHRECTOMY, LAPAROSCOPIC ADHESIOLYSIS (Left)  Patient Location: PACU  Anesthesia Type: General  Level of Consciousness: awake and alert   Airway and Oxygen Therapy: Patient Spontanous Breathing  Post-op Pain: mild  Post-op Assessment: Post-op Vital signs reviewed, Patient's Cardiovascular Status Stable, Respiratory Function Stable, Patent Airway and No signs of Nausea or vomiting  Last Vitals:  Filed Vitals:   07/19/14 1630  BP: 135/73  Pulse: 81  Temp: 36.4 C  Resp:     Post-op Vital Signs: stable   Complications: No apparent anesthesia complications

## 2014-07-19 NOTE — Transfer of Care (Signed)
Immediate Anesthesia Transfer of Care Note  Patient: Denise Lin  Procedure(s) Performed: Procedure(s): ROBOTIC ASSITED PARTIAL NEPHRECTOMY, LAPAROSCOPIC ADHESIOLYSIS (Left)  Patient Location: PACU  Anesthesia Type:General  Level of Consciousness: awake, alert , oriented and patient cooperative  Airway & Oxygen Therapy: Patient Spontanous Breathing and Patient connected to face mask oxygen  Post-op Assessment: Report given to PACU RN, Post -op Vital signs reviewed and stable and Patient moving all extremities X 4  Post vital signs: stable  Complications: No apparent anesthesia complications

## 2014-07-19 NOTE — Anesthesia Preprocedure Evaluation (Addendum)
Anesthesia Evaluation  Patient identified by MRN, date of birth, ID band Patient awake    Reviewed: Allergy & Precautions, H&P , NPO status , Patient's Chart, lab work & pertinent test results  Airway Mallampati: III TM Distance: <3 FB Neck ROM: Full    Dental no notable dental hx.    Pulmonary neg pulmonary ROS, former smoker,  breath sounds clear to auscultation  Pulmonary exam normal       Cardiovascular negative cardio ROS  Rhythm:Regular Rate:Normal     Neuro/Psych negative neurological ROS  negative psych ROS   GI/Hepatic negative GI ROS, Neg liver ROS,   Endo/Other  negative endocrine ROS  Renal/GU negative Renal ROS  negative genitourinary   Musculoskeletal negative musculoskeletal ROS (+)   Abdominal   Peds negative pediatric ROS (+)  Hematology negative hematology ROS (+)   Anesthesia Other Findings   Reproductive/Obstetrics negative OB ROS                         Anesthesia Physical Anesthesia Plan  ASA: II  Anesthesia Plan: General   Post-op Pain Management:    Induction: Intravenous  Airway Management Planned: Oral ETT  Additional Equipment:   Intra-op Plan:   Post-operative Plan: Extubation in OR  Informed Consent: I have reviewed the patients History and Physical, chart, labs and discussed the procedure including the risks, benefits and alternatives for the proposed anesthesia with the patient or authorized representative who has indicated his/her understanding and acceptance.   Dental advisory given  Plan Discussed with: CRNA and Surgeon  Anesthesia Plan Comments:        Anesthesia Quick Evaluation

## 2014-07-20 LAB — BASIC METABOLIC PANEL
Anion gap: 12 (ref 5–15)
BUN: 13 mg/dL (ref 6–23)
CO2: 24 mEq/L (ref 19–32)
Calcium: 8.8 mg/dL (ref 8.4–10.5)
Chloride: 102 mEq/L (ref 96–112)
Creatinine, Ser: 0.5 mg/dL (ref 0.50–1.10)
GFR calc Af Amer: >90 mL/min (ref >90)
GLUCOSE: 143 mg/dL — AB (ref 70–99)
Potassium: 4.3 mEq/L (ref 3.7–5.3)
Sodium: 138 mEq/L (ref 137–147)

## 2014-07-20 LAB — HEMOGLOBIN AND HEMATOCRIT, BLOOD
HCT: 32.3 % — ABNORMAL LOW (ref 36.0–46.0)
Hemoglobin: 10.9 g/dL — ABNORMAL LOW (ref 12.0–15.0)

## 2014-07-20 MED ORDER — DEXTROSE-NACL 5-0.45 % IV SOLN
INTRAVENOUS | Status: DC
  Administered 2014-07-20 – 2014-07-21 (×3): via INTRAVENOUS

## 2014-07-20 MED ORDER — ONDANSETRON HCL 4 MG/2ML IJ SOLN
4.0000 mg | INTRAMUSCULAR | Status: DC | PRN
  Administered 2014-07-20 – 2014-07-21 (×3): 4 mg via INTRAVENOUS
  Filled 2014-07-20 (×3): qty 2

## 2014-07-20 NOTE — Progress Notes (Signed)
Urology Inpatient Progress Report POD#1 Left robotic assisted lap partial nephrecomty  Intv/Subj: No acute events overnight. Patient's biggest complaint is of headache Abdominal pain mild Threw up tylenol last night - denies any nausea this AM  Past Medical History  Diagnosis Date  . Endometriosis   . Uterine polyp   . Headache(784.0)     hx of migraines   . Bell's palsy     recent   . GERD (gastroesophageal reflux disease)   . Hematuria     recent   . Seizures     as an infant no problems since   . Anemia     with endometriosis    Current Facility-Administered Medications  Medication Dose Route Frequency Provider Last Rate Last Dose  . acetaminophen (TYLENOL) tablet 1,000 mg  1,000 mg Oral 4 times per day Debbrah Alar, PA-C   1,000 mg at 07/20/14 0618  . HYDROmorphone (DILAUDID) injection 0.5-1 mg  0.5-1 mg Intravenous Q2H PRN Debbrah Alar, PA-C      . lactated ringers infusion   Intravenous Continuous Ardis Hughs, MD 125 mL/hr at 07/20/14 0700    . ondansetron (ZOFRAN) injection 4 mg  4 mg Intravenous Q4H PRN Alexis Frock, MD   4 mg at 07/20/14 0045  . oxyCODONE (Oxy IR/ROXICODONE) immediate release tablet 5 mg  5 mg Oral Q4H PRN Debbrah Alar, PA-C         Objective: Vital: Filed Vitals:   07/19/14 2145 07/20/14 0057 07/20/14 0514 07/20/14 0528  BP: 125/55 115/64 131/65   Pulse: 111 100 106   Temp: 99 F (37.2 C) 99.1 F (37.3 C) 98.6 F (37 C)   TempSrc:  Oral Oral   Resp: 16 20 16    Height:      Weight:    66.044 kg (145 lb 9.6 oz)  SpO2: 100% 99% 99%    I/Os: I/O last 3 completed shifts: In: 4345.8 [I.V.:4345.8] Out: 6269 [Urine:1580; Drains:45; Blood:50]  Physical Exam:  General: Patient is in no apparent distress Lungs: Normal respiratory effort, chest expands symmetrically. GI: The abdomen is soft incisions c/d/i JP with serosanguinous draininage Ext: lower extremities symmetric  Lab Results:  Recent Labs  07/19/14 1601  07/20/14 0406  HGB 11.9* 10.9*  HCT 35.9* 32.3*    Recent Labs  07/20/14 0406  NA 138  K 4.3  CL 102  CO2 24  GLUCOSE 143*  BUN 13  CREATININE 0.50  CALCIUM 8.8   No results found for this basename: LABPT, INR,  in the last 72 hours No results found for this basename: LABURIN,  in the last 72 hours No results found for this or any previous visit.  Studies/Results:   Assessment: 1 Day Post-Op, doing well  Plan: Continue current pain regimen  - recommended that she drink some coffee this am which may help with her caffeine headache Needs to get up out of bed this AM and begin moving around ADAT - recommended small frequent meals vs three large meals. Will d/c foley this AM. Will likely ready for discharge tomorrow AM.  Ardis Hughs 07/20/2014, 8:23 AM

## 2014-07-20 NOTE — Op Note (Signed)
[name], [name] NO.:  [id number]  MEDICAL RECORD NO.:  [id number]  LOCATION:  24                         FACILITY:  [facility]  PHYSICIAN:  Denise Frock, MD     DATE OF BIRTH:  1951/08/01  DATE OF PROCEDURE: 07/19/2014 DATE OF DISCHARGE:                              OPERATIVE REPORT   DIAGNOSIS:  Small left renal mass.  PROCEDURES: 1. Robotic-assisted laparoscopic left partial nephrectomy. 2. Limited laparoscopic adhesiolysis.  ESTIMATED BLOOD LOSS:  100 mL.  COMPLICATIONS:  None.  SPECIMENS: 1. Left renal mass for permanent pathology. 2. Fat over left renal mass. 3. Warm ischemia time 10 minutes.  FINDINGS: 1. Dense omental adhesions overlying the left upper quadrant and     surface of the colon, query history of diverticulitis. 2. Single artery, single vein, left renovascular anatomy.  ASSISTANT:  Denise Baptist, PA  DRAINS: 1. Jackson-Pratt drain to bulb suction. 2. Foley catheter to straight drain.  INDICATION:  Denise Lin is a pleasant 63 year old lady who was found on workup of hematuria to have a small left enhancing renal mass, worrisome for localized malignancy.  Options were discussed for management including surveillance versus ablative therapies versus surgical extirpation with and without nephron sparing and with and without minimally invasive assistance and she wished to undergo left robotic partial nephrectomy.  Informed consent was obtained and placed in the medical record.  PROCEDURE IN DETAIL:  The patient being Denise Lin, was verified. Procedure being left robotic partial nephrectomy was confirmed. Procedure was carried out.  Time-out was performed.  Intravenous antibiotics were administered.  General endotracheal anesthesia was introduced.  The patient was placed into a left side up, full flank position applying 15 degrees stable flexion, superior arm elevator, axillary roll, sequential compression devices and beanbag.   She was further fashioned the operating table using 3-inch tape over foam padding.  Sterile field was created by prepping and draping the patient's infra-xiphoid abdomen and left flank using chlorhexidine gluconate.  Notably, a Foley catheter had been placed per urethra to straight drain.  Next, a high-flow, low-pressure pneumoperitoneum was obtained using Veress technique and the left lower quadrant having passed the aspiration and drop test.  Next, a 12-mm robotic camera port was placed and then positioned approximately 4 fingerbreadths superior lateral to the umbilicus.  Laparoscopic examination of the peritoneal cavity revealed multifocal dense omental adhesions in the area of the left upper quadrant, no adherence of the colon, questionable history of diverticulosis or diverticulitis given this.  There was no visceral injury.  Additional ports were in place as follows:  Left subcostal 8-mm robotic port, left far lateral 8-mm robotic port, left paramedian suprapubic 8-mm robotic port approximately 1 handbreadth superior to the either ramus or paramedian location, a 12-mm assistant port in the midline 3 fingerbreadths below the camera port and 12-mm camera port in the midline 3 fingerbreadths above the camera port.  Robot was docked and passed through electronic checks.  Initial attention was directed to the adhesiolysis.  Very carefully adhesiolysis was performed, dropping dense omental adhesions out of the left upper quadrant and away from the anterior surface of the colon, which notably there  were some diverticulosis without evidence of present diverticulitis.  Incision was made lateral to the descending colon from the area of the splenic flexure towards the area of the internal ring, and the colon was carefully swept medially.  Lower pole of the kidney was identified and placed on gentle lateral traction, dissection was proceeded medial to this.  The ureter was encountered, placed  on gentle lateral traction. Gonadal vein was encountered and left to lie medially.  This triangle was developed towards the area of the left renal hilum.  Notably, the tail of pancreas was encountered and very carefully mobilized medially as such till out of the way.  The renal hilum was encountered and consisted of single artery, single vein, left renovascular anatomy as expected.  A single very large lumbar vein was positively identified very close to the insertion of the gonadal vein and circumferential mobilization of the artery and vein was performed purposely distal to this location to avoid any injury to this large lumbar vessel.  These vessels were marked with vessel loops.  Attention was then directed to identification of the mass.  The lateral aspect of the kidney was carefully defatted and the mass in question was seen at overlying the mass, directly was set aside for permanent pathology, labeled as such. The area was appeared to be exophytic, amenable to the partial nephrectomy.  With the planned approach, the area was scored circumferentially keeping what appeared to be approximately 5-mm margin away from the mass, 12.5 mg of mannitol was given intravenously and allowed to circulate.  Next, the artery was doubly clamped using laparoscopic vessel clamps and partial nephrectomy was performed using cold scissors keeping what appeared to be a small rim of normal- appearing renal parenchyma with mass, this was set aside for later retrieval.  Renorrhaphy was performed, first layer being running 3-0 V- Loc suture, oversewing any small vessels were entered through the collecting system, which there appeared to be none.  This was delicately cinched in place and anchored with Hem-o-Lok clips and the hilum was taken off of warm ischemia.  A 5 mL of SurgiFlo was placed into the renorrhaphy bed, which appeared to be hemostatic and second parenchymal apposition layer was applied using 2-0  V-Loc sutures and seen most between interval Hem-o-Lok clips.  Following these maneuvers, hemostasis appeared excellent.  There was excellent parenchymal apposition.  The needles were removed.  Sponge and needle counts were correct.  The small renal mass was set aside for permanent pathology into an EndoCatch retrieval bag.  Retroperitoneum was reapproximated in the colon to its normal anatomic position and anchoring the lateral colonic tissue, but not the colon directly to the posterior abdominal wall.  Closed suction drain was brought through the previous left lateral most robotic port site to area of the peritoneal cavity.  Robot was then undocked.  Then, the specimen was retrieved by removing via the superior most assistant port site without additional dilation and set aside for permanent pathology.  The two assistant port sites were closed at the level of the fascia using figure-of-eight 0 Vicryl as was the camera port site.  All incisions were infiltrated with dilute lyophilized Marcaine and closed at the level of the skin using subcuticular Monocryl followed by Dermabond.  Drain stitch was applied next to the bulb suction.  Procedure was then terminated.  The patient tolerated the procedure well.  There were no immediate periprocedural complications. The patient was taken to the postanesthesia care unit in stable condition.  ______________________________ Denise Frock, MD     TM/MEDQ  D:  07/19/2014  T:  07/20/2014  Job:  [id number]

## 2014-07-20 NOTE — Progress Notes (Signed)
Patient ambulated in hallway > 200 feet. Patient tolerated well. Will continue to encourage to ambulate and use of incentive spirometer. Will continue to monitor patient. Setzer, Marchelle Folks

## 2014-07-21 MED ORDER — IBUPROFEN 800 MG PO TABS
800.0000 mg | ORAL_TABLET | Freq: Once | ORAL | Status: AC
  Administered 2014-07-21: 800 mg via ORAL
  Filled 2014-07-21: qty 1

## 2014-07-21 MED ORDER — ONDANSETRON HCL 4 MG PO TABS: 4.0000 mg | ORAL_TABLET | Freq: Three times a day (TID) | ORAL | Status: DC | PRN

## 2014-07-21 NOTE — Progress Notes (Signed)
Urology Inpatient Progress Report POD#1 Left robotic assisted lap partial nephrecomty  Intv/Subj: Foley out - voiding without issue Flatus, but no BM Headache and nausea - likely migraine Tolerating food without nausea/emesis   Past Medical History  Diagnosis Date  . Endometriosis   . Uterine polyp   . Headache(784.0)     hx of migraines   . Bell's palsy     recent   . GERD (gastroesophageal reflux disease)   . Hematuria     recent   . Seizures     as an infant no problems since   . Anemia     with endometriosis    Current Facility-Administered Medications  Medication Dose Route Frequency Provider Last Rate Last Dose  . HYDROmorphone (DILAUDID) injection 0.5-1 mg  0.5-1 mg Intravenous Q2H PRN Debbrah Alar, PA-C      . ibuprofen (ADVIL,MOTRIN) tablet 800 mg  800 mg Oral Once Ardis Hughs, MD      . ondansetron [facility]) injection 4 mg  4 mg Intravenous Q4H PRN Alexis Frock, MD   4 mg at 07/21/14 0820  . oxyCODONE (Oxy IR/ROXICODONE) immediate release tablet 5 mg  5 mg Oral Q4H PRN Debbrah Alar, PA-C   5 mg at 07/21/14 0027     Objective: Vital: Filed Vitals:   07/20/14 0528 07/20/14 1243 07/20/14 1900 07/21/14 0432  BP:  112/56 139/70 151/84  Pulse:  108 100 96  Temp:  98.5 F (36.9 C) 99.4 F (37.4 C) 98.4 F (36.9 C)  TempSrc:  Oral Oral Oral  Resp:  16 16 16   Height:      Weight: 66.044 kg (145 lb 9.6 oz)     SpO2:  98% 97% 95%   I/Os: I/O last 3 completed shifts: In: 4425.8 [P.O.:780; I.V.:3645.8] Out: 0258 [Urine:6000; Drains:120]  Physical Exam:  General: Patient is in no apparent distress Lungs: Normal respiratory effort, chest expands symmetrically. GI: The abdomen is soft incisions c/d/i JP output scant this AM Ext: lower extremities symmetric  Lab Results:  Recent Labs  07/19/14 1601 07/20/14 0406  HGB 11.9* 10.9*  HCT 35.9* 32.3*    Recent Labs  07/20/14 0406  NA 138  K 4.3  CL 102  CO2 24  GLUCOSE 143*  BUN 13   CREATININE 0.50  CALCIUM 8.8   No results found for this basename: LABPT, INR,  in the last 72 hours No results found for this basename: LABURIN,  in the last 72 hours No results found for this or any previous visit.  Studies/Results:   Assessment: 2 Days Post-Op, doing well - ready for d/c  Plan: 800mg  Ibuprofen for headache x 1  Adding zofran to d/c meds D/c JP HLIVF D/c home.  Louis Meckel W 07/21/2014, 9:22 AM

## 2014-07-21 NOTE — Discharge Summary (Addendum)
Date of admission: 07/19/2014  Date of discharge: 07/21/2014  Admission diagnosis: small left renal mass  Discharge diagnosis: small left renal mass, migraine  Secondary diagnoses:  Patient Active Problem List   Diagnosis Date Noted  . Renal mass 07/19/2014    History and Physical: For full details, please see admission history and physical. Briefly, Denise Lin is a 63 y.o. year old patient with small left renal mass.   Hospital Course: Patient tolerated the procedure well.  She was then transferred to the floor after an uneventful PACU stay.  Her hospital course was uncomplicated.  On POD#2  she had met discharge criteria: was eating a regular diet, was up and ambulating independently,  pain was well controlled, was voiding without a catheter, and was ready to for discharge.   Laboratory values:   Recent Labs  07/19/14 1601 07/20/14 0406  HGB 11.9* 10.9*  HCT 35.9* 32.3*    Recent Labs  07/20/14 0406  NA 138  K 4.3  CL 102  CO2 24  GLUCOSE 143*  BUN 13  CREATININE 0.50  CALCIUM 8.8   No results found for this basename: LABPT, INR,  in the last 72 hours No results found for this basename: LABURIN,  in the last 72 hours No results found for this or any previous visit.  Disposition: Home  Discharge instruction: The patient was instructed to be ambulatory but told to refrain from heavy lifting, strenuous activity, or driving.   Discharge medications:    Medication List    STOP taking these medications       aspirin-acetaminophen-caffeine 250-250-65 MG per tablet  Commonly known as:  EXCEDRIN MIGRAINE     B-complex with vitamin C tablet     glucosamine-chondroitin 500-400 MG tablet     HAIR/SKIN/NAILS PO     Omega 3 1200 MG Caps     vitamin C 500 MG tablet  Commonly known as:  ASCORBIC ACID     vitamin E 400 UNIT capsule  Generic drug:  vitamin E      TAKE these medications       HYDROcodone-acetaminophen 5-325 MG per tablet  Commonly known as:   NORCO  Take 1-2 tablets by mouth every 6 (six) hours as needed.     ondansetron 4 MG tablet  Commonly known as:  ZOFRAN  Take 1 tablet (4 mg total) by mouth every 8 (eight) hours as needed for nausea or vomiting.        Followup:      Follow-up Information   Follow up with Alexis Frock, MD On 08/05/2014. 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

## 2014-07-22 ENCOUNTER — Encounter (HOSPITAL_COMMUNITY): Payer: Self-pay | Admitting: Urology

## 2015-09-26 ENCOUNTER — Ambulatory Visit (INDEPENDENT_AMBULATORY_CARE_PROVIDER_SITE_OTHER): Payer: Medicare Other | Admitting: Pediatrics

## 2015-09-26 ENCOUNTER — Encounter: Payer: Self-pay | Admitting: Pediatrics

## 2015-09-26 ENCOUNTER — Ambulatory Visit: Payer: Medicare Other

## 2015-09-26 VITALS — BP 144/91 | HR 77 | Temp 97.3°F | Ht 65.0 in | Wt 146.0 lb

## 2015-09-26 DIAGNOSIS — Z23 Encounter for immunization: Secondary | ICD-10-CM | POA: Diagnosis not present

## 2015-09-26 DIAGNOSIS — L989 Disorder of the skin and subcutaneous tissue, unspecified: Secondary | ICD-10-CM | POA: Diagnosis not present

## 2015-09-26 DIAGNOSIS — K7689 Other specified diseases of liver: Secondary | ICD-10-CM | POA: Insufficient documentation

## 2015-09-26 DIAGNOSIS — Z13820 Encounter for screening for osteoporosis: Secondary | ICD-10-CM | POA: Diagnosis not present

## 2015-09-26 DIAGNOSIS — Z1211 Encounter for screening for malignant neoplasm of colon: Secondary | ICD-10-CM | POA: Diagnosis not present

## 2015-09-26 DIAGNOSIS — Z13220 Encounter for screening for lipoid disorders: Secondary | ICD-10-CM | POA: Diagnosis not present

## 2015-09-26 DIAGNOSIS — Z1239 Encounter for other screening for malignant neoplasm of breast: Secondary | ICD-10-CM | POA: Diagnosis not present

## 2015-09-26 DIAGNOSIS — G510 Bell's palsy: Secondary | ICD-10-CM | POA: Insufficient documentation

## 2015-09-26 DIAGNOSIS — R160 Hepatomegaly, not elsewhere classified: Secondary | ICD-10-CM

## 2015-09-26 DIAGNOSIS — N2889 Other specified disorders of kidney and ureter: Secondary | ICD-10-CM | POA: Diagnosis not present

## 2015-09-26 DIAGNOSIS — S4991XD Unspecified injury of right shoulder and upper arm, subsequent encounter: Secondary | ICD-10-CM

## 2015-09-26 NOTE — Patient Instructions (Signed)
Check blood pressures at home Let me know if regularly over 140 on top on over 90 on the bottom

## 2015-09-26 NOTE — Progress Notes (Signed)
Subjective:    Patient ID: Denise Lin, female    DOB: [birth year], 64 y.o.   MRN: [id number]  CC: multiple med problem f/u  HPI: Denise Lin is a 64 y.o. female presenting on 09/26/2015 for New Patient (Initial Visit)  No complaints today. No problems with joints/muscles. talking with husband about walking regularly hasnt started yet Bowls twice a week, has some pain with her R shoulder at times No CP, no SOB  Due for colonoscopy.  Due for mammogram. No abnormal pap smears.  ROS: All systems negative other than what is in HPI  Past Medical History Patient Active Problem List   Diagnosis Date Noted  . Bell's palsy 09/26/2015  . Liver mass 09/26/2015  . Renal mass 07/19/2014   Social History   Social History  . Marital Status: Married    Spouse Name: N/A  . Number of Children: N/A  . Years of Education: N/A   Occupational History  . Not on file.   Social History Main Topics  . Smoking status: Former Research scientist (life sciences)  . Smokeless tobacco: Never Used  . Alcohol Use: Yes     Comment: 1-2 glasses of wine daily   . Drug Use: No  . Sexual Activity: Yes    Birth Control/ Protection: Post-menopausal   Other Topics Concern  . Not on file   Social History Narrative   Fam hx: Dad parkinsons, heart block, HTN PGM heart failure PGF colon cancer MGF pancreatic cancer, emphysema  Mom lung cancer  No current outpatient prescriptions on file.   No current facility-administered medications for this visit.       Objective:    BP 144/91 mmHg  Pulse 77  Temp(Src) 97.3 F (36.3 C) (Oral)  Ht 5' 5"  (1.651 m)  Wt 146 lb (66.225 kg)  BMI 24.30 kg/m2  Wt Readings from Last 3 Encounters:  09/26/15 146 lb (66.225 kg)  07/20/14 145 lb 9.6 oz (66.044 kg)  07/11/14 142 lb (64.411 kg)    Gen: NAD, alert, cooperative with exam, NCAT EYES: EOMI, no scleral injection or icterus ENT:  TMs pearly gray b/l, OP without erythema LYMPH: no cervical LAD Breast: normal exam CV:  NRRR, normal S1/S2, no murmur, distal pulses 2+ b/l Resp: CTABL, no wheezes, normal WOB Abd: +BS, soft, NTND. no guarding or organomegaly Ext: No edema, warm Neuro: Alert and oriented, strength equal b/l UE and LE, coordination grossly normal MSK: normal ROM shoulders     Assessment & Plan:   Stevee was seen today for multiple med problem f/u.  Diagnoses and all orders for this visit:  Shoulder injury, right, subsequent encounter Continue symptomatic care.  Renal mass non-cancerous, f/u scan with no change. Needs repeat scan for liver mass next year. -     CBC -     BMP8+EGFR  Screening for colon cancer due for colonoscopy -     Ambulatory referral to Gastroenterology  Screening for breast cancer due for mammogram -     MM DIGITAL SCREENING BILATERAL; Future  Screening for osteoporosis due for osteoporosis screening -     DG Bone Density; Future  Skin lesions, generalized wants to see dermatology for skin check because of history of multiple lesions needing removal -     Ambulatory referral to Dermatology  Screening for hyperlipidemia no personal hx of HD or CVA -     Lipid panel  Elevated blood pressure no hx of elevated blood pressure. Has cuff at home. Will let  me know if regularly over 140/90  Encounter for immunization  Other orders -     Flu Vaccine QUAD 36+ mos IM  Follow up plan: Return in about 1 year (around 09/25/2016) for schedule for mammogram when available.  Assunta Found, MD [employer] [facility] 09/26/2015, 8:32 AM

## 2015-09-27 LAB — BMP8+EGFR
BUN/Creatinine Ratio: 32 — ABNORMAL HIGH (ref 11–26)
BUN: 18 mg/dL (ref 8–27)
CO2: 25 mmol/L (ref 18–29)
Calcium: 9.4 mg/dL (ref 8.7–10.3)
Chloride: 101 mmol/L (ref 97–106)
Creatinine, Ser: 0.57 mg/dL (ref 0.57–1.00)
GFR calc Af Amer: 113 mL/min/{1.73_m2} (ref >59)
GFR, EST NON AFRICAN AMERICAN: 98 mL/min/{1.73_m2} (ref >59)
GLUCOSE: 105 mg/dL — AB (ref 65–99)
Potassium: 5 mmol/L (ref 3.5–5.2)
Sodium: 141 mmol/L (ref 136–144)

## 2015-09-27 LAB — CBC
Hematocrit: 39.9 % (ref 34.0–46.6)
Hemoglobin: 13.2 g/dL (ref 11.1–15.9)
MCH: 28.8 pg (ref 26.6–33.0)
MCHC: 33.1 g/dL (ref 31.5–35.7)
MCV: 87 fL (ref 79–97)
Platelets: 307 10*3/uL (ref 150–379)
RBC: 4.58 x10E6/uL (ref 3.77–5.28)
RDW: 14 % (ref 12.3–15.4)
WBC: 6.2 10*3/uL (ref 3.4–10.8)

## 2015-09-27 LAB — LIPID PANEL
Chol/HDL Ratio: 4.5 ratio units — ABNORMAL HIGH (ref 0.0–4.4)
Cholesterol, Total: 219 mg/dL — ABNORMAL HIGH (ref 100–199)
HDL: 49 mg/dL (ref >39)
LDL Calculated: 142 mg/dL — ABNORMAL HIGH (ref 0–99)
Triglycerides: 138 mg/dL (ref 0–149)
VLDL Cholesterol Cal: 28 mg/dL (ref 5–40)

## 2015-09-29 ENCOUNTER — Encounter (INDEPENDENT_AMBULATORY_CARE_PROVIDER_SITE_OTHER): Payer: Self-pay | Admitting: *Deleted

## 2015-11-04 ENCOUNTER — Encounter: Payer: Medicare Other | Admitting: Pharmacist

## 2015-11-05 ENCOUNTER — Ambulatory Visit (INDEPENDENT_AMBULATORY_CARE_PROVIDER_SITE_OTHER): Payer: Medicare Other | Admitting: Pharmacist

## 2015-11-05 ENCOUNTER — Encounter: Payer: Self-pay | Admitting: Pharmacist

## 2015-11-05 DIAGNOSIS — M8580 Other specified disorders of bone density and structure, unspecified site: Secondary | ICD-10-CM | POA: Diagnosis not present

## 2015-11-05 NOTE — Progress Notes (Signed)
Osteoporosis Clinic Current Height:        Max Lifetime Height:  5'5" Current Weight:         Ethnicity:Caucasian  BP:       HR:         HPI: Does pt already have a diagnosis of:  Osteopenia?  Yes Osteoporosis?  No  Back Pain?  No       Kyphosis?  No Prior fracture?  No Med(s) for Osteoporosis/Osteopenia:  none Med(s) previously tried for Osteoporosis/Osteopenia:  none                                                             PMH: Age at menopause:  64 yo Hysterectomy?  No Oophorectomy?  No HRT? No Steroid Use?  No Thyroid med?  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  No Current or previous eating disorders?  No Last Vitamin D Result:  Not checked recently Last GFR Result:  98 (10/07/2015)   FH/SH: Family history of osteoporosis?  No Parent with history of hip fracture?  No Family history of breast cancer?  No Exercise?  No Smoking?  No Alcohol?  No    Calcium Assessment Calcium Intake  # of servings/day  Calcium mg  Milk (8 oz) 0  x  300  = 0  Yogurt (4 oz) 0 x  200 = 0  Cheese (1 oz) 0 x  200 = 0  Other Calcium sources  (hummus and spinach)  400mg   Ca supplement MVI = 400mg    Estimated calcium intake per day 800mg     DEXA Results Date of Test T-Score for AP Spine L1-L4 T-Score for Total Left Hip T-Score for Total Right Hip  11/118/2016 0.1 -0.6 -1.1                  FRAX 10 year estimate: Total FX risk:  9.6%(consider medication if >/= 20%) Hip FX risk:  1.3% (consider medication if >/= 3%)  Assessment: Osteopenia with low FRAX estimate.  Recommendations: 1.   Discussed BMD  / DEXA results and discussed fracture risk. 2.  recommend calcium 1200mg  daily through supplementation or diet.  3.  recommend weight bearing exercise - 30 minutes at least 4 days per week.   4.  Counseled and educated about fall risk and prevention.  Orders Placed This Encounter  Procedures  . VITAMIN D 25  Hydroxy (Vit-D Deficiency, Fractures)     Recheck DEXA:  2 years  Time spent counseling patient:  20 minutes   Cherre Robins, PharmD, CPP

## 2015-11-05 NOTE — Patient Instructions (Signed)
Exercise for Strong Bones  Exercise is important to build and maintain strong bones / bone density.  There are 2 types of exercises that are important to building and maintaining strong bones:  Weight- bearing and muscle-stregthening.  Weight-bearing Exercises  These exercises include activities that make you move against gravity while staying upright. Weight-bearing exercises can be high-impact or low-impact.  High-impact weight-bearing exercises help build bones and keep them strong. If you have broken a bone due to osteoporosis or are at risk of breaking a bone, you may need to avoid high-impact exercises. If you're not sure, you should check with your healthcare provider.  Examples of high-impact weight-bearing exercises are: Dancing  Doing high-impact aerobics  Hiking  Jogging/running  Jumping Rope  Stair climbing  Tennis  Low-impact weight-bearing exercises can also help keep bones strong and are a safe alternative if you cannot do high-impact exercises.   Examples of low-impact weight-bearing exercises are: Using elliptical training machines  Doing low-impact aerobics  Using stair-step machines  Fast walking on a treadmill or outside   Muscle-Strengthening Exercises These exercises include activities where you move your body, a weight or some other resistance against gravity. They are also known as resistance exercises and include: Lifting weights  Using elastic exercise bands  Using weight machines  Lifting your own body weight  Functional movements, such as standing and rising up on your toes  Yoga and Pilates can also improve strength, balance and flexibility. However, certain positions may not be safe for people with osteoporosis or those at increased risk of broken bones. For example, exercises that have you bend forward may increase the chance of breaking a bone in the spine.   Non-Impact Exercises There are other types of exercises that can help  prevent falls.  Non-impact exercises can help you to improve balance, posture and how well you move in everyday activities. Some of these exercises include: Balance exercises that strengthen your legs and test your balance, such as Tai Chi, can decrease your risk of falls.  Posture exercises that improve your posture and reduce rounded or "sloping" shoulders can help you decrease the chance of breaking a bone, especially in the spine.  Functional exercises that improve how well you move can help you with everyday activities and decrease your chance of falling and breaking a bone. For example, if you have trouble getting up from a chair or climbing stairs, you should do these activities as exercises.   **A physical therapist can teach you balance, posture and functional exercises. He/she can also help you learn which exercises are safe and appropriate for you.  [facility] has a physical therapy office in [place] in front of our office and referrals can be made for assessments and treatment as needed and strength and balance training.  If you would like to have an assessment with [facility] and our physical therapy team please let a nurse or provider know.    Fall Prevention in the Home  Falls can cause injuries and can affect people from all age groups. There are many simple things that you can do to make your home safe and to help prevent falls. WHAT CAN I DO ON THE OUTSIDE OF MY HOME?  Regularly repair the edges of walkways and driveways and fix any cracks.  Remove high doorway thresholds.  Trim any shrubbery on the main path into your home.  Use bright outdoor lighting.  Clear walkways of debris and clutter, including tools and rocks.  Regularly check   that handrails are securely fastened and in good repair. Both sides of any steps should have handrails.  Install guardrails along the edges of any raised decks or porches.  Have leaves, snow, and ice cleared regularly.  Use sand or salt on  walkways during winter months.  In the garage, clean up any spills right away, including grease or oil spills. WHAT CAN I DO IN THE BATHROOM?  Use night lights.  Install grab bars by the toilet and in the tub and shower. Do not use towel bars as grab bars.  Use non-skid mats or decals on the floor of the tub or shower.  If you need to sit down while you are in the shower, use a plastic, non-slip stool..  Keep the floor dry. Immediately clean up any water that spills on the floor.  Remove soap buildup in the tub or shower on a regular basis.  Attach bath mats securely with double-sided non-slip rug tape.  Remove throw rugs and other tripping hazards from the floor. WHAT CAN I DO IN THE BEDROOM?  Use night lights.  Make sure that a bedside light is easy to reach.  Do not use oversized bedding that drapes onto the floor.  Have a firm chair that has side arms to use for getting dressed.  Remove throw rugs and other tripping hazards from the floor. WHAT CAN I DO IN THE KITCHEN?   Clean up any spills right away.  Avoid walking on wet floors.  Place frequently used items in easy-to-reach places.  If you need to reach for something above you, use a sturdy step stool that has a grab bar.  Keep electrical cables out of the way.  Do not use floor polish or wax that makes floors slippery. If you have to use wax, make sure that it is non-skid floor wax.  Remove throw rugs and other tripping hazards from the floor. WHAT CAN I DO IN THE STAIRWAYS?  Do not leave any items on the stairs.  Make sure that there are handrails on both sides of the stairs. Fix handrails that are broken or loose. Make sure that handrails are as long as the stairways.  Check any carpeting to make sure that it is firmly attached to the stairs. Fix any carpet that is loose or worn.  Avoid having throw rugs at the top or bottom of stairways, or secure the rugs with carpet tape to prevent them from  moving.  Make sure that you have a light switch at the top of the stairs and the bottom of the stairs. If you do not have them, have them installed. WHAT ARE SOME OTHER FALL PREVENTION TIPS?  Wear closed-toe shoes that fit well and support your feet. Wear shoes that have rubber soles or low heels.  When you use a stepladder, make sure that it is completely opened and that the sides are firmly locked. Have someone hold the ladder while you are using it. Do not climb a closed stepladder.  Add color or contrast paint or tape to grab bars and handrails in your home. Place contrasting color strips on the first and last steps.  Use mobility aids as needed, such as canes, walkers, scooters, and crutches.  Turn on lights if it is dark. Replace any light bulbs that burn out.  Set up furniture so that there are clear paths. Keep the furniture in the same spot.  Fix any uneven floor surfaces.  Choose a carpet design that does not   hide the edge of steps of a stairway.  Be aware of any and all pets.  Review your medicines with your healthcare provider. Some medicines can cause dizziness or changes in blood pressure, which increase your risk of falling. Talk with your health care provider about other ways that you can decrease your risk of falls. This may include working with a physical therapist or trainer to improve your strength, balance, and endurance.   This information is not intended to replace advice given to you by your health care provider. Make sure you discuss any questions you have with your health care provider.   Document Released: 10/15/2002 Document Revised: 03/11/2015 Document Reviewed: 11/29/2014 Elsevier Interactive Patient Education 2016 Elsevier Inc.  

## 2015-11-06 ENCOUNTER — Telehealth: Payer: Self-pay | Admitting: Pharmacist

## 2015-11-06 DIAGNOSIS — E559 Vitamin D deficiency, unspecified: Secondary | ICD-10-CM

## 2015-11-06 LAB — VITAMIN D 25 HYDROXY (VIT D DEFICIENCY, FRACTURES): Vit D, 25-Hydroxy: 20.8 ng/mL — ABNORMAL LOW (ref 30.0–100.0)

## 2015-11-06 MED ORDER — VITAMIN D 1000 UNITS PO TABS: 1000.0000 [IU] | ORAL_TABLET | Freq: Every day | ORAL | Status: DC

## 2015-11-06 NOTE — Telephone Encounter (Signed)
Patient aware of vitamin D results - recommended that she start OTC vitamin D 1000 IU take 1 capsule daily.  Recheck vitamin D in 3 months - orders placed.

## 2015-11-07 LAB — HM MAMMOGRAPHY

## 2015-11-28 ENCOUNTER — Encounter: Payer: Self-pay | Admitting: *Deleted

## 2015-12-05 ENCOUNTER — Telehealth: Payer: Self-pay | Admitting: Pediatrics

## 2016-01-09 ENCOUNTER — Encounter: Payer: Self-pay | Admitting: *Deleted

## 2016-02-04 ENCOUNTER — Other Ambulatory Visit: Payer: Self-pay | Admitting: *Deleted

## 2016-02-04 ENCOUNTER — Other Ambulatory Visit: Payer: Medicare Other

## 2016-02-04 DIAGNOSIS — E559 Vitamin D deficiency, unspecified: Secondary | ICD-10-CM

## 2016-02-05 ENCOUNTER — Other Ambulatory Visit: Payer: Self-pay | Admitting: Pharmacist

## 2016-02-05 DIAGNOSIS — R7989 Other specified abnormal findings of blood chemistry: Secondary | ICD-10-CM

## 2016-02-05 LAB — VITAMIN D 25 HYDROXY (VIT D DEFICIENCY, FRACTURES): Vit D, 25-Hydroxy: 21.3 ng/mL — ABNORMAL LOW (ref 30.0–100.0)

## 2016-02-05 MED ORDER — VITAMIN D (ERGOCALCIFEROL) 1.25 MG (50000 UNIT) PO CAPS: 50000.0000 [IU] | ORAL_CAPSULE | ORAL | Status: DC

## 2016-06-02 ENCOUNTER — Other Ambulatory Visit: Payer: Self-pay | Admitting: Urology

## 2016-06-02 DIAGNOSIS — R160 Hepatomegaly, not elsewhere classified: Secondary | ICD-10-CM

## 2016-07-26 ENCOUNTER — Ambulatory Visit
Admission: RE | Admit: 2016-07-26 | Discharge: 2016-07-26 | Disposition: A | Discharge status: Home / Self Care | Payer: Medicare Other | Source: Ambulatory Visit | Attending: Urology | Admitting: Urology

## 2016-07-26 DIAGNOSIS — R160 Hepatomegaly, not elsewhere classified: Secondary | ICD-10-CM

## 2016-07-26 MED ORDER — GADOBENATE DIMEGLUMINE 529 MG/ML IV SOLN
13.0000 mL | Freq: Once | INTRAVENOUS | Status: AC | PRN
  Administered 2016-07-26: 13 mL via INTRAVENOUS

## 2016-08-03 ENCOUNTER — Telehealth: Payer: Self-pay | Admitting: Pediatrics

## 2016-08-03 NOTE — Telephone Encounter (Signed)
Needs to be seen

## 2016-08-04 NOTE — Telephone Encounter (Signed)
Patient aware. Appointment made 10/5 9:30 with MMM.

## 2016-08-12 ENCOUNTER — Encounter: Payer: Self-pay | Admitting: Nurse Practitioner

## 2016-08-12 ENCOUNTER — Ambulatory Visit (INDEPENDENT_AMBULATORY_CARE_PROVIDER_SITE_OTHER): Payer: Medicare Other | Admitting: Nurse Practitioner

## 2016-08-12 VITALS — BP 150/90 | HR 80 | Temp 97.2°F | Ht 65.0 in | Wt 147.0 lb

## 2016-08-12 DIAGNOSIS — N2889 Other specified disorders of kidney and ureter: Secondary | ICD-10-CM | POA: Diagnosis not present

## 2016-08-12 DIAGNOSIS — M8580 Other specified disorders of bone density and structure, unspecified site: Secondary | ICD-10-CM

## 2016-08-12 DIAGNOSIS — R42 Dizziness and giddiness: Secondary | ICD-10-CM

## 2016-08-12 DIAGNOSIS — R160 Hepatomegaly, not elsewhere classified: Secondary | ICD-10-CM | POA: Diagnosis not present

## 2016-08-12 DIAGNOSIS — E559 Vitamin D deficiency, unspecified: Secondary | ICD-10-CM

## 2016-08-12 NOTE — Patient Instructions (Signed)

## 2016-08-12 NOTE — Progress Notes (Signed)
Subjective:    Patient ID: Denise Lin, female    DOB: Mar 09, 1951, 65 y.o.   MRN: [id number]  HPI 65 y/o white female presents today stating that for the past few weeks she gets a dizzy feeling that is relieved by eating. She has almost passed out one time while driving home after she had been fasting for a MRI. She has refused to drive since the event until this morning as she is afraid of passing out at the wheel. In addition, she just hasn't felt like herself lately. Denies fatigue or weakness. Admits to feeling off balance at times.   Liver Mass: patient states she had a MRI last month that she states she was told looks good, that is only showed a little scar on her liver.  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 She only took the weekly prescription until it ran out. She states she was not aware of the daily prescription, or that maybe she just forgot.   Outpatient Encounter Prescriptions as of 08/12/2016  Medication Sig  . [DISCONTINUED] cholecalciferol (VITAMIN D) 1000 units tablet Take 1 tablet (1,000 Units total) by mouth daily.  . [DISCONTINUED] Vitamin D, Ergocalciferol, (DRISDOL) 50000 units CAPS capsule Take 1 capsule (50,000 Units total) by mouth every 7 (seven) days. **take once per week**   No facility-administered encounter medications on file as of 08/12/2016.     Review of Systems  Constitutional: Positive for activity change. Negative for fatigue and unexpected weight change.       Not driving d/t dizzy episodes.  Eyes: Positive for itching.       Has dry eyes that itch at times. States she plans to follow-up with her eye doctor.   Genitourinary: Positive for frequency.       States she keeps water near her even during the night to stay hydrated.  Neurological: Positive for dizziness and  syncope. Negative for weakness and headaches.  All other systems reviewed and are negative.      Objective:   Physical Exam  Constitutional: She is oriented to person, place, and time. She appears well-developed and well-nourished.  HENT:  Head: Normocephalic.  Right Ear: External ear normal.  Left Ear: External ear normal.  Mouth/Throat: Oropharynx is clear and moist.  Eyes: Conjunctivae and EOM are normal. Pupils are equal, round, and reactive to light.  Neck: Normal range of motion. Neck supple. No thyromegaly present.  Cardiovascular: Normal rate, regular rhythm, normal heart sounds and intact distal pulses.   Pulmonary/Chest: Effort normal and breath sounds normal.  Abdominal: Soft. Bowel sounds are normal.  Musculoskeletal: Normal range of motion.  Lymphadenopathy:    She has no cervical adenopathy.  Neurological: She is alert and oriented to person, place, and time.  Skin: Skin is warm and dry.  Psychiatric: She has a normal mood and affect. Her behavior is normal. Judgment and thought content normal.    BP (!) 150/90   Pulse 80   Temp 97.2 F (36.2 C) (Oral)   Ht _0  (1.651 m)   Wt 147 lb (66.7 kg)   BMI 24.46 kg/m       Assessment & Plan:   1. Dizziness and giddiness Do not drive while feeling dizzy. Keep a diary of dizziness including if you have eaten, what makes it  worse and what makes it better Fall precautions - CMP14+EGFR - CBC with Differential - Anemia Profile B - Ambulatory referral to Neurology  2. Osteopenia, unspecified location Does not want to take Vitamin D  3. Liver mass Recent MRI   4. Renal mass Mass removed almost 3 years ago  5. Vitamin D deficiency Does not want to take Vitamin D    Labs pending Health maintenance reviewed Diet and exercise encouraged Continue all meds Follow up  In 3 months   [place], FNP

## 2016-08-13 LAB — ANEMIA PROFILE B
BASOS: 1 %
Basophils Absolute: 0.1 10*3/uL (ref 0.0–0.2)
EOS (ABSOLUTE): 0.1 10*3/uL (ref 0.0–0.4)
Eos: 2 %
FERRITIN: 260 ng/mL — AB (ref 15–150)
Folate: 14.2 ng/mL (ref >3.0)
HEMATOCRIT: 39.4 % (ref 34.0–46.6)
Hemoglobin: 13.6 g/dL (ref 11.1–15.9)
IMMATURE GRANS (ABS): 0.1 10*3/uL (ref 0.0–0.1)
IRON SATURATION: 30 % (ref 15–55)
Immature Granulocytes: 1 %
Iron: 95 ug/dL (ref 27–139)
LYMPHS ABS: 1.7 10*3/uL (ref 0.7–3.1)
Lymphs: 27 %
MCH: 29.1 pg (ref 26.6–33.0)
MCHC: 34.5 g/dL (ref 31.5–35.7)
MCV: 84 fL (ref 79–97)
MONOS ABS: 0.4 10*3/uL (ref 0.1–0.9)
Monocytes: 6 %
Neutrophils Absolute: 4 10*3/uL (ref 1.4–7.0)
Neutrophils: 63 %
PLATELETS: 306 10*3/uL (ref 150–379)
RBC: 4.68 x10E6/uL (ref 3.77–5.28)
RDW: 13.9 % (ref 12.3–15.4)
RETIC CT PCT: 0.8 % (ref 0.6–2.6)
Total Iron Binding Capacity: 321 ug/dL (ref 250–450)
UIBC: 226 ug/dL (ref 118–369)
Vitamin B-12: 416 pg/mL (ref 211–946)
WBC: 6.3 10*3/uL (ref 3.4–10.8)

## 2016-08-13 LAB — CMP14+EGFR
ALT: 57 IU/L — ABNORMAL HIGH (ref 0–32)
AST: 29 IU/L (ref 0–40)
Albumin/Globulin Ratio: 1.9 (ref 1.2–2.2)
Albumin: 4.9 g/dL — ABNORMAL HIGH (ref 3.6–4.8)
Alkaline Phosphatase: 117 IU/L (ref 39–117)
BUN/Creatinine Ratio: 20 (ref 12–28)
BUN: 11 mg/dL (ref 8–27)
Bilirubin Total: 0.5 mg/dL (ref 0.0–1.2)
CO2: 27 mmol/L (ref 18–29)
Calcium: 9.4 mg/dL (ref 8.7–10.3)
Chloride: 102 mmol/L (ref 96–106)
Creatinine, Ser: 0.54 mg/dL — ABNORMAL LOW (ref 0.57–1.00)
GFR calc Af Amer: 114 mL/min/1.73
GFR calc non Af Amer: 99 mL/min/1.73
Globulin, Total: 2.6 g/dL (ref 1.5–4.5)
Glucose: 93 mg/dL (ref 65–99)
Potassium: 4.4 mmol/L (ref 3.5–5.2)
Sodium: 143 mmol/L (ref 134–144)
Total Protein: 7.5 g/dL (ref 6.0–8.5)

## 2016-10-06 ENCOUNTER — Telehealth: Payer: Self-pay | Admitting: Pediatrics

## 2016-10-18 ENCOUNTER — Ambulatory Visit: Payer: Medicare Other | Admitting: Neurology

## 2016-11-12 ENCOUNTER — Ambulatory Visit: Payer: Medicare Other | Admitting: Pediatrics

## 2016-12-15 ENCOUNTER — Ambulatory Visit (INDEPENDENT_AMBULATORY_CARE_PROVIDER_SITE_OTHER): Payer: Medicare Other

## 2016-12-15 DIAGNOSIS — Z23 Encounter for immunization: Secondary | ICD-10-CM

## 2018-08-28 ENCOUNTER — Other Ambulatory Visit: Payer: Self-pay

## 2018-08-28 ENCOUNTER — Ambulatory Visit (INDEPENDENT_AMBULATORY_CARE_PROVIDER_SITE_OTHER): Payer: Medicare Other | Admitting: Family Medicine

## 2018-08-28 ENCOUNTER — Encounter: Payer: Self-pay | Admitting: Family Medicine

## 2018-08-28 VITALS — BP 130/82 | HR 75 | Temp 98.7°F | Resp 16 | Ht 64.5 in | Wt 144.0 lb

## 2018-08-28 DIAGNOSIS — K7689 Other specified diseases of liver: Secondary | ICD-10-CM

## 2018-08-28 DIAGNOSIS — F40298 Other specified phobia: Secondary | ICD-10-CM | POA: Insufficient documentation

## 2018-08-28 DIAGNOSIS — R05 Cough: Secondary | ICD-10-CM

## 2018-08-28 DIAGNOSIS — D1771 Benign lipomatous neoplasm of kidney: Secondary | ICD-10-CM | POA: Diagnosis not present

## 2018-08-28 DIAGNOSIS — R053 Chronic cough: Secondary | ICD-10-CM

## 2018-08-28 DIAGNOSIS — Z23 Encounter for immunization: Secondary | ICD-10-CM

## 2018-08-28 DIAGNOSIS — M85852 Other specified disorders of bone density and structure, left thigh: Secondary | ICD-10-CM | POA: Diagnosis not present

## 2018-08-28 DIAGNOSIS — J069 Acute upper respiratory infection, unspecified: Secondary | ICD-10-CM

## 2018-08-28 DIAGNOSIS — H04123 Dry eye syndrome of bilateral lacrimal glands: Secondary | ICD-10-CM

## 2018-08-28 HISTORY — DX: Benign lipomatous neoplasm of kidney: D17.71

## 2018-08-28 NOTE — Patient Instructions (Signed)
Please return in 4 weeks for your annual complete physical; please come fasting.  Treat your congestions with sudafed.  You may use Delsym cough syrup or Mucinex DM to help with congestion and coughing.  Please go to our [facility] office to get your xrays done. You can walk in M-F between 8am and 5pm. Tell them you are there for xrays ordered by me. They will send me the results, then I will let you know the results with instructions.   Address: [place], [place], [place]  (office sits at [address] at [address] intersection; from here, turn left onto [place] [address]), take to [address], turn right and go for a mile or so, office will be on left across form [employer] )  It was a pleasure meeting you today! Thank you for choosing [place] to meet your healthcare needs! I truly look forward to working with you. If you have any questions or concerns, please send me a message via Mychart or call the office at [phone].   Cough, Adult Coughing is a reflex that clears your throat and your airways. Coughing helps to heal and protect your lungs. It is normal to cough occasionally, but a cough that happens with other symptoms or lasts a long time may be a sign of a condition that needs treatment. A cough may last only 2-3 weeks (acute), or it may last longer than 8 weeks (chronic). What are the causes? Coughing is commonly caused by:  Breathing in substances that irritate your lungs.  A viral or bacterial respiratory infection.  Allergies.  Asthma.  Postnasal drip.  Smoking.  Acid backing up from the stomach into the esophagus (gastroesophageal reflux).  Certain medicines.  Chronic lung problems, including COPD (or rarely, lung cancer).  Other medical conditions such as heart failure.  Follow these instructions at home: Pay attention to any changes in your symptoms. Take these actions to help with your  discomfort:  Take medicines only as told by your health care provider. ? If you were prescribed an antibiotic medicine, take it as told by your health care provider. Do not stop taking the antibiotic even if you start to feel better. ? Talk with your health care provider before you take a cough suppressant medicine.  Drink enough fluid to keep your urine clear or pale yellow.  If the air is dry, use a cold steam vaporizer or humidifier in your bedroom or your home to help loosen secretions.  Avoid anything that causes you to cough at work or at home.  If your cough is worse at night, try sleeping in a semi-upright position.  Avoid cigarette smoke. If you smoke, quit smoking. If you need help quitting, ask your health care provider.  Avoid caffeine.  Avoid alcohol.  Rest as needed.  Contact a health care provider if:  You have new symptoms.  You cough up pus.  Your cough does not get better after 2-3 weeks, or your cough gets worse.  You cannot control your cough with suppressant medicines and you are losing sleep.  You develop pain that is getting worse or pain that is not controlled with pain medicines.  You have a fever.  You have unexplained weight loss.  You have night sweats. Get help right away if:  You cough up blood.  You have difficulty breathing.  Your heartbeat is very fast. This information is not intended to replace advice given to you  by your health care provider. Make sure you discuss any questions you have with your health care provider. Document Released: 04/23/2011 Document Revised: 04/01/2016 Document Reviewed: 01/01/2015 Elsevier Interactive Patient Education  [employer].

## 2018-08-28 NOTE — Progress Notes (Signed)
Subjective  CC:  Chief Complaint  Patient presents with  . Establish Care  . Cough    x 1 month.    HPI: Denise Lin is a 67 y.o. female who presents to [place] [facility] today to establish care with me as a new patient.   She has the following concerns or needs:  67 year old female transferring from [facility].  Her last visit there however was over 2 years ago.  She tends not to like to go to the doctor and his fear of several medications.  However she has not been feeling well over the last month.  She is married and lives with her husband.  Retired Ship broker.  Overall happy.  She does admit that she tends to worry about her health.  Currently complains of a one-month dry hacking cough that is slightly getting better.  However over the last week she is also developed nasal congestion without sinus pain, fever, sore throat or myalgias.  She denies shortness of breath.  She reports a yellow nasal discharge.  She also is concerned that she has nasal parasites because she sees worms at times.  She reports that this problem is been ongoing for a year.  She has not taken any medications for her symptoms.  She denies GERD symptoms, melena, weight loss.  No shortness of breath.  No history of lung disease.  She is a non-smoker.  Review of systems positive for vaginal irritation redness and itching.  She denies nocturia or polyuria.  Positive sneezing  Health maintenance: Due for physical exam, breast cancer screening, colon cancer screening, immunizations.  Assessment  1. Persistent cough   2. Angiomyolipoma of kidney   3. Osteopenia of left hip   4. Focal nodular hyperplasia of liver   5. Dry eyes   6. Viral upper respiratory tract infection   7. Encounter for immunization      Plan   Persistent cough with nasal congestion: Not sure if related or separate problems.  She believes she has a viral URI.  We will treat supportively  with Sudafed and recheck in 1 week if not improved.  No symptoms of bacterial sinus infection or bronchitis.  Check chest x-ray due to cough.  It is improving.  Consider GERD.  Health maintenance: Patient to return for complete physical with lab work.  Given her complaint of vaginal itching, she will need work-up for yeast vaginitis and/or diabetes.  Nasal congestion with concerns for parasites: She is had no recent travel and no history of immunosuppression.  I recommend her bring in a picture to so I can see what she is talking about.  Allegra and Sudafed for now.  Other chronic problems discussed as above, well controlled.  Health maintenance: She will need mammogram and bone density follow-up as well as immunizations.  Flu shot given today.  Follow up:  Return in about 4 weeks (around 09/25/2018) for complete physical, recheck. Orders Placed This Encounter  Procedures  . DG Chest 2 View  . Flu vaccine HIGH DOSE PF   No orders of the defined types were placed in this encounter.    Depression screen [facility] 2/9 08/12/2016 09/26/2015  Decreased Interest 0 0  Down, Depressed, Hopeless 0 0  PHQ - 2 Score 0 0    We updated and reviewed the patient's past history in detail and it is documented below.  Patient Active Problem List   Diagnosis Date Noted  .  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 Dry eyes 08/28/2018  . Fear of side effects of medication 08/28/2018  . Vitamin D deficiency 11/06/2015  . Osteopenia 11/05/2015    Dexa 10/2015 lowest T = - 1.7 left hip, nl spine. Osteopenia.    . Bell's palsy 09/26/2015  . 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 Followed by [facility]. Benign and stable. (vs fibroadenoma). Never biopsied    Health Maintenance  Topic Date Due  . Hepatitis C Screening  1951-01-18  . TETANUS/TDAP  10/28/1968  . COLONOSCOPY  10/29/1999  . PNA vac  Low Risk Adult (2 of 2 - PPSV23) 08/31/2015  . DEXA SCAN  09/25/2017  . MAMMOGRAM  11/06/2017  . INFLUENZA VACCINE  06/08/2018   Immunization History  Administered Date(s) Administered  . Influenza, High Dose Seasonal PF 12/15/2016, 08/28/2018  . Influenza,inj,Quad PF,6+ Mos 09/26/2015   No outpatient medications have been marked as taking for the 08/28/18 encounter (Office Visit) with Leamon Arnt, MD.    Allergies: Patient has No Known Allergies. Past Medical History Patient  has a past medical history of Anemia, Angiomyolipoma of kidney (08/28/2018), Bell's palsy, Endometriosis, GERD (gastroesophageal reflux disease), Hematuria, Osteopenia, Seizures ([place]), and Uterine polyp. Past Surgical History Patient  has a past surgical history that includes Uterine fibroid surgery; Nose surgery; Appendectomy; Dilation and curettage of uterus; Breast surgery; and Robotic assited partial nephrectomy (Left, 07/19/2014). Family History: Patient family history includes COPD in her maternal grandfather and mother; Colon cancer (age of onset: 65) in her paternal grandfather; Diabetes in her maternal grandfather; Drug abuse in her brother; Heart failure in her paternal grandmother; Hypertension in her father and son; Lung cancer in her mother; Obesity in her son; Pancreatic cancer in her maternal grandfather; Parkinson's disease in her father; Squamous cell carcinoma in her father; Suicidality in her brother. Social History:  Patient  reports that she has quit smoking. She has never used smokeless tobacco. She reports that she drinks alcohol. She reports that she does not use drugs.  Review of Systems: Constitutional: negative for fever or malaise Ophthalmic: negative for photophobia, double vision or loss of vision Cardiovascular: negative for chest pain, dyspnea on exertion, or new LE swelling Respiratory: negative for SOB or persistent cough Gastrointestinal: negative for abdominal pain, change in  bowel habits or melena Genitourinary: negative for dysuria or gross hematuria Musculoskeletal: negative for new gait disturbance or muscular weakness Integumentary: negative for new or persistent rashes Neurological: negative for TIA or stroke symptoms Psychiatric: negative for SI or delusions Allergic/Immunologic: negative for hives  Patient Care Team    Relationship Specialty Notifications Start End  Leamon Arnt, MD PCP - General Family Medicine  08/28/18     Objective  Vitals: BP 130/82   Pulse 75   Temp 98.7 F (37.1 C) (Oral)   Resp 16   Ht 5' 4.5" (1.638 m)   Wt 144 lb (65.3 kg)   SpO2 98%   BMI 24.34 kg/m  General:  Well developed, well nourished, no acute distress  Psych:  Alert and oriented, anxious mood and affect HEENT:  Normocephalic, atraumatic, non-icteric sclera, PERRL, nasal congestion present, clear, oropharynx is without mass or exudate, supple neck without adenopathy, mass or thyromegaly Cardiovascular:  RRR without gallop, rub or murmur, nondisplaced PMI Respiratory:  Good breath sounds bilaterally, CTAB with normal respiratory effort MSK:  no deformities, contusions. Joints are without erythema or swelling Skin:  Warm, no rashes or suspicious lesions noted Neurologic:    Mental status is normal. Gross motor and sensory exams are normal. Normal gait   Commons side effects, risks, benefits, and alternatives for medications and treatment plan prescribed today were discussed, and the patient expressed understanding of the given instructions. Patient is instructed to call or message via MyChart if he/she has any questions or concerns regarding our treatment plan. No barriers to understanding were identified. We discussed Red Flag symptoms and signs in detail. Patient expressed understanding regarding what to do in case of urgent or emergency type symptoms.   Medication list was reconciled, printed and provided to the patient in AVS. Patient instructions and  summary information was reviewed with the patient as documented in the AVS. This note was prepared with assistance of Dragon voice recognition software. Occasional wrong-word or sound-a-like substitutions may have occurred due to the inherent limitations of voice recognition software

## 2018-08-29 ENCOUNTER — Ambulatory Visit (INDEPENDENT_AMBULATORY_CARE_PROVIDER_SITE_OTHER): Payer: Medicare Other

## 2018-08-29 DIAGNOSIS — R05 Cough: Secondary | ICD-10-CM

## 2018-08-30 NOTE — Progress Notes (Signed)
Please call patient: I have reviewed his/her xray result. Please let her know it is completely normal.

## 2018-09-26 ENCOUNTER — Encounter: Payer: Medicare Other | Admitting: Family Medicine

## 2018-09-27 ENCOUNTER — Other Ambulatory Visit: Payer: Self-pay

## 2018-09-27 ENCOUNTER — Encounter: Payer: Self-pay | Admitting: Family Medicine

## 2018-09-27 ENCOUNTER — Ambulatory Visit (INDEPENDENT_AMBULATORY_CARE_PROVIDER_SITE_OTHER): Payer: Medicare Other | Admitting: Family Medicine

## 2018-09-27 VITALS — BP 122/80 | HR 75 | Temp 97.6°F | Ht 64.5 in | Wt 146.4 lb

## 2018-09-27 DIAGNOSIS — J309 Allergic rhinitis, unspecified: Secondary | ICD-10-CM

## 2018-09-27 DIAGNOSIS — M85852 Other specified disorders of bone density and structure, left thigh: Secondary | ICD-10-CM | POA: Diagnosis not present

## 2018-09-27 DIAGNOSIS — Z23 Encounter for immunization: Secondary | ICD-10-CM

## 2018-09-27 DIAGNOSIS — E559 Vitamin D deficiency, unspecified: Secondary | ICD-10-CM | POA: Diagnosis not present

## 2018-09-27 DIAGNOSIS — Z0000 Encounter for general adult medical examination without abnormal findings: Secondary | ICD-10-CM | POA: Diagnosis not present

## 2018-09-27 LAB — CBC WITH DIFFERENTIAL/PLATELET
Basophils Absolute: 0.1 10*3/uL (ref 0.0–0.1)
Basophils Relative: 1.3 % (ref 0.0–3.0)
EOS ABS: 0.1 10*3/uL (ref 0.0–0.7)
Eosinophils Relative: 1.5 % (ref 0.0–5.0)
HCT: 40.9 % (ref 36.0–46.0)
HEMOGLOBIN: 13.5 g/dL (ref 12.0–15.0)
LYMPHS ABS: 1.8 10*3/uL (ref 0.7–4.0)
Lymphocytes Relative: 32.2 % (ref 12.0–46.0)
MCHC: 33.1 g/dL (ref 30.0–36.0)
MCV: 88.8 fl (ref 78.0–100.0)
Monocytes Absolute: 0.3 10*3/uL (ref 0.1–1.0)
Monocytes Relative: 5.3 % (ref 3.0–12.0)
Neutro Abs: 3.3 10*3/uL (ref 1.4–7.7)
Neutrophils Relative %: 59.7 % (ref 43.0–77.0)
Platelets: 275 10*3/uL (ref 150.0–400.0)
RBC: 4.61 Mil/uL (ref 3.87–5.11)
RDW: 13.9 % (ref 11.5–15.5)
WBC: 5.5 10*3/uL (ref 4.0–10.5)

## 2018-09-27 LAB — COMPREHENSIVE METABOLIC PANEL
ALBUMIN: 4.7 g/dL (ref 3.5–5.2)
ALT: 32 U/L (ref 0–35)
AST: 22 U/L (ref 0–37)
Alkaline Phosphatase: 88 U/L (ref 39–117)
BUN: 16 mg/dL (ref 6–23)
CHLORIDE: 106 meq/L (ref 96–112)
CO2: 27 mEq/L (ref 19–32)
Calcium: 9.4 mg/dL (ref 8.4–10.5)
Creatinine, Ser: 0.62 mg/dL (ref 0.40–1.20)
GFR: 101.47 mL/min (ref >60.00)
Glucose, Bld: 103 mg/dL — ABNORMAL HIGH (ref 70–99)
POTASSIUM: 4.3 meq/L (ref 3.5–5.1)
SODIUM: 142 meq/L (ref 135–145)
TOTAL PROTEIN: 7.5 g/dL (ref 6.0–8.3)
Total Bilirubin: 0.4 mg/dL (ref 0.2–1.2)

## 2018-09-27 LAB — LIPID PANEL
CHOL/HDL RATIO: 5
CHOLESTEROL: 231 mg/dL — AB (ref 0–200)
HDL: 47.3 mg/dL (ref >39.00)
NonHDL: 183.29
Triglycerides: 232 mg/dL — ABNORMAL HIGH (ref 0.0–149.0)
VLDL: 46.4 mg/dL — AB (ref 0.0–40.0)

## 2018-09-27 LAB — POCT URINALYSIS DIPSTICK
Bilirubin, UA: NEGATIVE
GLUCOSE UA: NEGATIVE
Ketones, UA: NEGATIVE
Leukocytes, UA: NEGATIVE
Nitrite, UA: NEGATIVE
Protein, UA: POSITIVE — AB
SPEC GRAV UA: 1.02 (ref 1.010–1.025)
Urobilinogen, UA: 0.2 E.U./dL
pH, UA: 6 (ref 5.0–8.0)

## 2018-09-27 LAB — VITAMIN D 25 HYDROXY (VIT D DEFICIENCY, FRACTURES): VITD: 22.29 ng/mL — AB (ref 30.00–100.00)

## 2018-09-27 LAB — LDL CHOLESTEROL, DIRECT: Direct LDL: 130 mg/dL

## 2018-09-27 LAB — TSH: TSH: 1.92 u[IU]/mL (ref 0.35–4.50)

## 2018-09-27 MED ORDER — ZOSTER VAC RECOMB ADJUVANTED 50 MCG/0.5ML IM SUSR: 0.5000 mL | Freq: Once | INTRAMUSCULAR | 0 refills | Status: AC

## 2018-09-27 NOTE — Progress Notes (Signed)
Subjective  Chief Complaint  Patient presents with  . Annual Exam    doing well, patient is fasting, Pneumonvax ordered   . Cough    patient in concerned about her cough, she states it is not going away     HPI: Denise Lin is a 67 y.o. female who presents to [place] at [facility] today for a Female Wellness Visit. She also has the concerns and/or needs as listed above in the chief complaint. These will be addressed in addition to the Health Maintenance Visit.   Wellness Visit: annual visit with health maintenance review and exam without Pap   Health maintenance: Here for complete physical.  Due for mammogram and bone density screening.  Due for Pneumovax and Shingrix.  She is fasting for lab work today. Chronic disease f/u and/or acute problem visit: (deemed necessary to be done in addition to the wellness visit):  Persistent cough: Reviewed normal chest x-ray.  No complains of sinus congestion, postnasal drainage and ears full of fluid with difficulty hearing.  She denies sneezing.  She denies sinus pain, purulent drainage, fever or malaise.  She took Sudafed with some relief but does not want to take that daily.  No chest pain or shortness of breath  Assessment  1. Annual physical exam   2. Osteopenia of left hip   3. Vitamin D deficiency   4. Chronic allergic rhinitis      Plan  Female Wellness Visit:  Age appropriate Health Maintenance and Prevention measures were discussed with patient. Included topics are cancer screening recommendations, ways to keep healthy (see AVS) including dietary and exercise recommendations, regular eye and dental care, use of seat belts, and avoidance of moderate alcohol use and tobacco use.  Ordered mammogram and bone density screens.  BMI: discussed patient's BMI and encouraged positive lifestyle modifications to help get to or maintain a target BMI.  HM needs and immunizations were addressed and ordered. See below for  orders. See HM and immunization section for updates.  Pneumovax given today.  Prescription for Shingrix given today.  Education given  Routine labs and screening tests ordered including cmp, cbc and lipids where appropriate.  Discussed recommendations regarding Vit D and calcium supplementation (see AVS)  Chronic disease management visit and/or acute problem visit:  Discussed chronic allergies as likely source of congestion and eustachian tube dysfunction: Failed cerumen irrigation due to dizziness.  Recommend oral Zyrtec.  Patient declines Flonase at this time.  Recheck 4 weeks  Follow up: Return in about 4 weeks (around 10/25/2018) for recheck allergies, result review.  Orders Placed This Encounter  Procedures  . MM DIGITAL SCREENING BILATERAL  . DG Bone Density  . Pneumococcal polysaccharide vaccine 23-valent greater than or equal to 2yo subcutaneous/IM  . CBC with Differential/Platelet  . Comprehensive metabolic panel  . Lipid panel  . HIV Antibody (routine testing w rflx)  . TSH  . VITAMIN D 25 Hydroxy (Vit-D Deficiency, Fractures)  . Hepatitis C antibody  . POCT Urinalysis Dipstick   Meds ordered this encounter  Medications  . Zoster Vaccine Adjuvanted [facility]) injection    Sig: Inject 0.5 mLs into the muscle once for 1 dose. Please give 2nd dose 2-6 months after first dose    Dispense:  2 each    Refill:  0      Lifestyle: Body mass index is 24.74 kg/m. Wt Readings from Last 3 Encounters:  09/27/18 146 lb 6.4 oz (66.4 kg)  08/28/18 144 lb (65.3  kg)  08/12/16 147 lb (66.7 kg)   Diet: general Exercise: intermittently,   Patient Active Problem List   Diagnosis Date Noted  . Chronic allergic rhinitis 09/27/2018  . 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 Dry eyes 08/28/2018  . Fear of side effects of medication 08/28/2018  . Vitamin D deficiency 11/06/2015  . Osteopenia 11/05/2015    Dexa 10/2015 lowest T = - 1.7 left  hip, nl spine. Osteopenia.    . Bell's palsy 09/26/2015  . 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 Followed by [facility]. Benign and stable. (vs fibroadenoma). Never biopsied    Health Maintenance  Topic Date Due  . Hepatitis C Screening  12/23/1950  . PNA vac Low Risk Adult (2 of 2 - PPSV23) 08/31/2015  . MAMMOGRAM  11/06/2016  . DEXA SCAN  09/25/2017  . COLONOSCOPY  11/08/2024  . INFLUENZA VACCINE  Completed  . TETANUS/TDAP  Discontinued   Immunization History  Administered Date(s) Administered  . Influenza, High Dose Seasonal PF 12/15/2016, 08/28/2018  . Influenza,inj,Quad PF,6+ Mos 09/26/2015   We updated and reviewed the patient's past history in detail and it is documented below. Allergies: Patient has No Known Allergies. Past Medical History Patient  has a past medical history of Anemia, Angiomyolipoma of kidney (08/28/2018), Bell's palsy, Endometriosis, GERD (gastroesophageal reflux disease), Hematuria, Osteopenia, Seizures ([place]), and Uterine polyp. Past Surgical History Patient  has a past surgical history that includes Uterine fibroid surgery; Nose surgery; Appendectomy; Dilation and curettage of uterus; Breast surgery; and Robotic assited partial nephrectomy (Left, 07/19/2014). Family History: Patient family history includes COPD in her maternal grandfather and mother; Colon cancer (age of onset: 60) in her paternal grandfather; Diabetes in her maternal grandfather; Drug abuse in her brother; Heart failure in her paternal grandmother; Hypertension in her father and son; Lung cancer in her mother; Obesity in her son; Pancreatic cancer in her maternal grandfather; Parkinson's disease in her father; Squamous cell carcinoma in her father; Suicidality in her brother. Social History:  Patient  reports that she has quit smoking. She has never used smokeless tobacco. She reports that she drinks alcohol. She  reports that she does not use drugs.  Review of Systems: Constitutional: negative for fever or malaise Ophthalmic: negative for photophobia, double vision or loss of vision Cardiovascular: negative for chest pain, dyspnea on exertion, or new LE swelling Respiratory: negative for SOB or persistent cough Gastrointestinal: negative for abdominal pain, change in bowel habits or melena Genitourinary: negative for dysuria or gross hematuria, no abnormal uterine bleeding or disharge Musculoskeletal: negative for new gait disturbance or muscular weakness Integumentary: negative for new or persistent rashes, no breast lumps Neurological: negative for TIA or stroke symptoms Psychiatric: negative for SI or delusions Allergic/Immunologic: negative for hives  Patient Care Team    Relationship Specialty Notifications Start End  Leamon Arnt, MD PCP - General Family Medicine  08/28/18     Objective  Vitals: BP 122/80   Pulse 75   Temp 97.6 F (36.4 C)   Ht 5' 4.5" (1.638 m)   Wt 146 lb 6.4 oz (66.4 kg)   SpO2 96%   BMI 24.74 kg/m  General:  Well developed, well nourished, no acute distress, nasal congestion present Psych:  Alert and orientedx3,normal mood and affect HEENT:  Normocephalic, atraumatic, non-icteric sclera, PERRL, oropharynx is clear without mass or exudate,  TMs with normal landmarks and cerumen without obstruction, no sinus tenderness bilaterally, nasal mucosa is inflamed, supple neck without adenopathy, mass or thyromegaly Cardiovascular:  Normal S1, S2, RRR without gallop, rub or murmur, nondisplaced PMI Respiratory:  Good breath sounds bilaterally, CTAB with normal respiratory effort Gastrointestinal: normal bowel sounds, soft, non-tender, no noted masses. No HSM MSK: no deformities, contusions. Joints are without erythema or swelling. Spine and CVA region are nontender Skin:  Warm, no rashes or suspicious lesions noted Neurologic:    Mental status is normal. CN 2-11 are  normal. Gross motor and sensory exams are normal. Normal gait. No tremor Breast Exam: No mass, skin retraction or nipple discharge is appreciated in either breast. No axillary adenopathy. Fibrocystic changes are not noted    Commons side effects, risks, benefits, and alternatives for medications and treatment plan prescribed today were discussed, and the patient expressed understanding of the given instructions. Patient is instructed to call or message via MyChart if he/she has any questions or concerns regarding our treatment plan. No barriers to understanding were identified. We discussed Red Flag symptoms and signs in detail. Patient expressed understanding regarding what to do in case of urgent or emergency type symptoms.   Medication list was reconciled, printed and provided to the patient in AVS. Patient instructions and summary information was reviewed with the patient as documented in the AVS. This note was prepared with assistance of Dragon voice recognition software. Occasional wrong-word or sound-a-like substitutions may have occurred due to the inherent limitations of voice recognition software

## 2018-09-27 NOTE — Addendum Note (Signed)
Addended by: Katina Dung on: 09/27/2018 09:09 AM   Modules accepted: Orders

## 2018-09-27 NOTE — Patient Instructions (Addendum)
Please return in 4 weeks to recheck allergies and nasal congestion.   We will send you a letter with your test results. We will call you if needed.   We will call you with information regarding your referral appointment. Mammogram and bone density tests at the [facility] in [place].  If you do not hear from [place] within the next 2 weeks, please let me know. It can take 1-2 weeks to get appointments set up with the specialists.   You can go to a pharmacy and get your Shingles vaccinations anytime.   If you have any questions or concerns, please don't hesitate to send me a message via MyChart or call the office at [phone]. Thank you for visiting with [place] today! It's our pleasure caring for you.  Recommendations for women to keep healthy:   EXERCISE AND DIET: We recommended that you start or continue a regular exercise program for good health. Regular exercise means any activity that makes your heart beat faster and makes you sweat. We recommend exercising at least 30 minutes per day at least 3 days a week, preferably 4 or 5. We also recommend a diet low in fat and sugar. Inactivity, poor dietary choices and obesity can cause diabetes, heart attack, stroke, and kidney damage, among others.   ALCOHOL AND SMOKING: Women should limit their alcohol intake to no more than 7 drinks/beers/glasses of wine (combined, not each!) per week. Moderation of alcohol intake to this level decreases your risk of breast cancer and liver damage. And of course, no recreational drugs are part of a healthy lifestyle. And absolutely no smoking or even second hand smoke. Most people know smoking can cause heart and lung diseases, but did you know it also contributes to weakening of your bones? Aging of your skin? Yellowing of your teeth and nails?  CALCIUM AND VITAMIN D: Adequate intake of calcium and Vitamin D are recommended. The recommendations for exact amounts of these supplements seem to change often, but generally  speaking 600 mg of calcium (either carbonate or citrate) and 800 units of Vitamin D per day seems prudent. Certain women may benefit from higher intake of Vitamin D. If you are among these women, your doctor will have told you during your visit.   PAP SMEARS: Pap smears, to check for cervical cancer or precancers, have traditionally been done yearly, although recent scientific advances have shown that most women can have pap smears less often. However, every woman still should have a physical exam from her gynecologist every year. It will include a breast check, inspection of the vulva and vagina to check for abnormal growths or skin changes, a visual exam of the cervix, and then an exam to evaluate the size and shape of the uterus and ovaries. And after 69 years of age, a rectal exam is indicated to check for rectal cancers. We will also provide age appropriate advice regarding health maintenance, like when you should have certain vaccines, screening for sexually transmitted diseases, bone density testing, colonoscopy, mammograms, etc.   MAMMOGRAMS: All women over 32 years old should have a yearly mammogram. Many facilities now offer a "3D" mammogram, which may cost around $50 extra out of pocket. If possible, we recommend you accept the option to have the 3D mammogram performed. It both reduces the number of women who will be called back for extra views which then turn out to be normal, and it is better than the routine mammogram at detecting truly abnormal areas.  COLONOSCOPY: Colonoscopy to screen for colon cancer is recommended for all women at age 44. We know, you hate the idea of the prep. We agree, BUT, having colon cancer and not knowing it is worse!! Colon cancer so often starts as a polyp that can be seen and removed at colonscopy, which can quite literally save your life! And if your first colonoscopy is normal and you have no family history of colon cancer, most women don't have to have it again  for 10 years. Once every ten years, you can do something that may end up saving your life, right? We will be happy to help you get it scheduled when you are ready. Be sure to check your insurance coverage so you understand how much it will cost. It may be covered as a preventative service at no cost, but you should check your particular policy.    Allergic Rhinitis, Adult Allergic rhinitis is an allergic reaction that affects the mucous membrane inside the nose. It causes sneezing, a runny or stuffy nose, and the feeling of mucus going down the back of the throat (postnasal drip). Allergic rhinitis can be mild to severe. There are two types of allergic rhinitis:  Seasonal. This type is also called hay fever. It happens only during certain seasons.  Perennial. This type can happen at any time of the year.  What are the causes? This condition happens when the body's defense system (immune system) responds to certain harmless substances called allergens as though they were germs.  Seasonal allergic rhinitis is triggered by pollen, which can come from grasses, trees, and weeds. Perennial allergic rhinitis may be caused by:  House dust mites.  Pet dander.  Mold spores.  What are the signs or symptoms? Symptoms of this condition include:  Sneezing.  Runny or stuffy nose (nasal congestion).  Postnasal drip.  Itchy nose.  Tearing of the eyes.  Trouble sleeping.  Daytime sleepiness.  How is this diagnosed? This condition may be diagnosed based on:  Your medical history.  A physical exam.  Tests to check for related conditions, such as: ? Asthma. ? Pink eye. ? Ear infection. ? Upper respiratory infection.  Tests to find out which allergens trigger your symptoms. These may include skin or blood tests.  How is this treated? There is no cure for this condition, but treatment can help control symptoms. Treatment may include:  Taking medicines that block allergy symptoms, such  as antihistamines. Medicine may be given as a shot, nasal spray, or pill.  Avoiding the allergen.  Desensitization. This treatment involves getting ongoing shots until your body becomes less sensitive to the allergen. This treatment may be done if other treatments do not help.  If taking medicine and avoiding the allergen does not work, new, stronger medicines may be prescribed.  Follow these instructions at home:  Find out what you are allergic to. Common allergens include smoke, dust, and pollen.  Avoid the things you are allergic to. These are some things you can do to help avoid allergens: ? Replace carpet with wood, tile, or vinyl flooring. Carpet can trap dander and dust. ? Do not smoke. Do not allow smoking in your home. ? Change your heating and air conditioning filter at least once a month. ? During allergy season:  Keep windows closed as much as possible.  Plan outdoor activities when pollen counts are lowest. This is usually during the evening hours.  When coming indoors, change clothing and shower before sitting on furniture  or bedding.  Take over-the-counter and prescription medicines only as told by your health care provider.  Keep all follow-up visits as told by your health care provider. This is important. Contact a health care provider if:  You have a fever.  You develop a persistent cough.  You make whistling sounds when you breathe (you wheeze).  Your symptoms interfere with your normal daily activities. Get help right away if:  You have shortness of breath. Summary  This condition can be managed by taking medicines as directed and avoiding allergens.  Contact your health care provider if you develop a persistent cough or fever.  During allergy season, keep windows closed as much as possible. This information is not intended to replace advice given to you by your health care provider. Make sure you discuss any questions you have with your health care  provider. Document Released: 07/20/2001 Document Revised: 12/02/2016 Document Reviewed: 12/02/2016 Elsevier Interactive Patient Education  [employer].

## 2018-09-28 LAB — HIV ANTIBODY (ROUTINE TESTING W REFLEX): HIV 1&2 Ab, 4th Generation: NONREACTIVE

## 2018-09-28 LAB — HEPATITIS C ANTIBODY
Hepatitis C Ab: NONREACTIVE
SIGNAL TO CUT-OFF: 0.01 (ref <1.00)

## 2018-09-29 ENCOUNTER — Telehealth: Payer: Self-pay | Admitting: Family Medicine

## 2018-09-29 NOTE — Telephone Encounter (Signed)
Results are not in [facility] NT basket to release

## 2018-09-29 NOTE — Telephone Encounter (Signed)
I spoke with Patient and went over lab results.   Doloris Hall,  LPN

## 2018-09-29 NOTE — Progress Notes (Signed)
Please call patient: I have reviewed his/her lab results. Lab testing results look fine. Her sugar and cholesterol are a little elevated: please work on eating a low fat low sugar diet to help this. As well, Vit D levels are a little low: please start taking caltrate plus twice a day to increase your vitamin D levels and for bone health. It is over the counter. Everything else looks fine!  The 10-year ASCVD risk score Mikey Bussing DC Brooke Bonito., et al., 2013) is: 7.7%   Values used to calculate the score:     Age: 67 years     Sex: Female     Is Non-Hispanic African American: No     Diabetic: No     Tobacco smoker: No     Systolic Blood Pressure: 343 mmHg     Is BP treated: No     HDL Cholesterol: 47.3 mg/dL     Total Cholesterol: 231 mg/dL

## 2018-09-29 NOTE — Telephone Encounter (Signed)
Copied from [place]. Topic: Quick Communication - Lab Results (Clinic Use ONLY) >> Sep 29, 2018  2:17 PM Sigurd Sos, LPN wrote: Called patient to inform them of 09/29/2018 lab results. When patient returns call, triage nurse may disclose results.

## 2018-10-23 ENCOUNTER — Ambulatory Visit: Payer: Medicare Other | Admitting: Family Medicine

## 2018-10-23 ENCOUNTER — Encounter: Payer: Self-pay | Admitting: Family Medicine

## 2018-10-23 ENCOUNTER — Other Ambulatory Visit: Payer: Self-pay

## 2018-10-23 VITALS — BP 142/90 | HR 80 | Temp 97.6°F | Ht 64.5 in | Wt 145.6 lb

## 2018-10-23 DIAGNOSIS — J309 Allergic rhinitis, unspecified: Secondary | ICD-10-CM | POA: Diagnosis not present

## 2018-10-23 MED ORDER — FLUTICASONE PROPIONATE 50 MCG/ACT NA SUSP: 1.0000 | Freq: Every day | NASAL | 6 refills | Status: DC

## 2018-10-23 NOTE — Progress Notes (Signed)
Subjective  CC:  Chief Complaint  Patient presents with  . Follow-up    allegies and nasal Congestion, not been taking anything much for it   . Ear Pain    right ear is still feels clogged up     HPI: Denise Lin is a 67 y.o. female who presents to the office today to address the problems listed above in the chief complaint.  Here for f/u of cough and allergy sxs. See last note.   Doing better: less cough. Less nasal congestion; still with right ear fullness and some PND. Believes it is allergies now. No sob or cp. Hasn't taken meds but would like to start flonase.   HM: hasn't yet had shingrix; needs to call to schedule mammo and dexa Assessment  1. Chronic allergic rhinitis      Plan   AR:  Education given. flonase ordered.   Follow up: Return in about 11 months (around 09/24/2019) for complete physical.  Visit date not found  No orders of the defined types were placed in this encounter.  Meds ordered this encounter  Medications  . fluticasone (FLONASE) 50 MCG/ACT nasal spray    Sig: Place 1 spray into both nostrils daily.    Dispense:  16 g    Refill:  6      I reviewed the patients updated PMH, FH, and SocHx.    Patient Active Problem List   Diagnosis Date Noted  . Chronic allergic rhinitis 09/27/2018  . 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 Dry eyes 08/28/2018  . Fear of side effects of medication 08/28/2018  . Vitamin D deficiency 11/06/2015  . Osteopenia 11/05/2015  . Bell's palsy 09/26/2015  . 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    Allergies: Patient has No Known Allergies. Family History: Patient family history includes COPD in her maternal grandfather and mother; Colon cancer (age of onset: 92) in her paternal grandfather; Diabetes in her maternal grandfather; Drug abuse in her brother; Heart failure in her  paternal grandmother; Hypertension in her father and son; Lung cancer in her mother; Obesity in her son; Pancreatic cancer in her maternal grandfather; Parkinson's disease in her father; Squamous cell carcinoma in her father; Suicidality in her brother. Social History:  Patient  reports that she has quit smoking. She has never used smokeless tobacco. She reports current alcohol use. She reports that she does not use drugs.  Review of Systems: Constitutional: Negative for fever malaise or anorexia Cardiovascular: negative for chest pain Respiratory: negative for SOB or persistent cough Gastrointestinal: negative for abdominal pain  Objective  Vitals: BP (!) 142/90   Pulse 80   Temp 97.6 F (36.4 C)   Ht 5' 4.5" (1.638 m)   Wt 145 lb 9.6 oz (66 kg)   SpO2 98%   BMI 24.61 kg/m  General: no acute distress , A&Ox3 HEENT: PEERL, conjunctiva normal, Oropharynx moist,neck is supple, TMs normal landmarks, nasal congestion present w/o sinus ttp Cardiovascular:  RRR without murmur or gallop.  Respiratory:  Good breath sounds bilaterally, CTAB with normal respiratory effort Skin:  Warm, no rashes     Commons side effects, risks, benefits, and alternatives for medications and treatment plan prescribed today were discussed, and the patient expressed understanding of the given instructions. Patient is instructed to call or message via MyChart if he/she has any questions or concerns  regarding our treatment plan. No barriers to understanding were identified. We discussed Red Flag symptoms and signs in detail. Patient expressed understanding regarding what to do in case of urgent or emergency type symptoms.   Medication list was reconciled, printed and provided to the patient in AVS. Patient instructions and summary information was reviewed with the patient as documented in the AVS. This note was prepared with assistance of Dragon voice recognition software. Occasional wrong-word or sound-a-like  substitutions may have occurred due to the inherent limitations of voice recognition software

## 2018-10-23 NOTE — Patient Instructions (Addendum)
Please return in November 2020 for your annual complete physical; please come fasting. Start the flonase daily, one spray each nostril. I have ordered it for you.   Please call [facility] and call to schedule your bone density and mammogram appointments.  [phone]  If you have any questions or concerns, please don't hesitate to send me a message via MyChart or call the office at [phone]. Thank you for visiting with [place] today! It's our pleasure caring for you.

## 2019-01-15 DIAGNOSIS — C4441 Basal cell carcinoma of skin of scalp and neck: Secondary | ICD-10-CM | POA: Diagnosis not present

## 2019-01-15 DIAGNOSIS — L821 Other seborrheic keratosis: Secondary | ICD-10-CM | POA: Diagnosis not present

## 2019-01-15 DIAGNOSIS — L738 Other specified follicular disorders: Secondary | ICD-10-CM | POA: Diagnosis not present

## 2019-01-15 DIAGNOSIS — Z08 Encounter for follow-up examination after completed treatment for malignant neoplasm: Secondary | ICD-10-CM | POA: Diagnosis not present

## 2019-01-15 DIAGNOSIS — Z85828 Personal history of other malignant neoplasm of skin: Secondary | ICD-10-CM | POA: Diagnosis not present

## 2019-08-01 DIAGNOSIS — C44519 Basal cell carcinoma of skin of other part of trunk: Secondary | ICD-10-CM | POA: Diagnosis not present

## 2019-08-01 DIAGNOSIS — D225 Melanocytic nevi of trunk: Secondary | ICD-10-CM | POA: Diagnosis not present

## 2019-08-01 DIAGNOSIS — D1801 Hemangioma of skin and subcutaneous tissue: Secondary | ICD-10-CM | POA: Diagnosis not present

## 2019-08-01 DIAGNOSIS — D485 Neoplasm of uncertain behavior of skin: Secondary | ICD-10-CM | POA: Diagnosis not present

## 2019-08-01 DIAGNOSIS — L814 Other melanin hyperpigmentation: Secondary | ICD-10-CM | POA: Diagnosis not present

## 2019-08-01 DIAGNOSIS — Z85828 Personal history of other malignant neoplasm of skin: Secondary | ICD-10-CM | POA: Diagnosis not present

## 2019-08-01 DIAGNOSIS — C44219 Basal cell carcinoma of skin of left ear and external auricular canal: Secondary | ICD-10-CM | POA: Diagnosis not present

## 2019-08-21 ENCOUNTER — Other Ambulatory Visit: Payer: Self-pay

## 2019-08-21 DIAGNOSIS — Z20822 Contact with and (suspected) exposure to covid-19: Secondary | ICD-10-CM

## 2019-08-23 ENCOUNTER — Telehealth: Payer: Self-pay | Admitting: Family Medicine

## 2019-08-23 LAB — NOVEL CORONAVIRUS, NAA: SARS-CoV-2, NAA: NOT DETECTED

## 2019-08-23 NOTE — Telephone Encounter (Signed)
Pt aware of results and will callback to schedule CPE

## 2019-08-23 NOTE — Telephone Encounter (Signed)
Pt called in asking for the results from her COVID test. Pt can be reached at the home #

## 2019-10-10 DIAGNOSIS — C44219 Basal cell carcinoma of skin of left ear and external auricular canal: Secondary | ICD-10-CM | POA: Diagnosis not present

## 2020-01-28 DIAGNOSIS — D1801 Hemangioma of skin and subcutaneous tissue: Secondary | ICD-10-CM | POA: Diagnosis not present

## 2020-01-28 DIAGNOSIS — L820 Inflamed seborrheic keratosis: Secondary | ICD-10-CM | POA: Diagnosis not present

## 2020-01-28 DIAGNOSIS — L821 Other seborrheic keratosis: Secondary | ICD-10-CM | POA: Diagnosis not present

## 2020-01-28 DIAGNOSIS — L718 Other rosacea: Secondary | ICD-10-CM | POA: Diagnosis not present

## 2020-03-17 ENCOUNTER — Ambulatory Visit (INDEPENDENT_AMBULATORY_CARE_PROVIDER_SITE_OTHER): Payer: Medicare Other | Admitting: Family Medicine

## 2020-03-17 ENCOUNTER — Ambulatory Visit (INDEPENDENT_AMBULATORY_CARE_PROVIDER_SITE_OTHER): Payer: Medicare Other

## 2020-03-17 ENCOUNTER — Encounter: Payer: Self-pay | Admitting: Family Medicine

## 2020-03-17 ENCOUNTER — Other Ambulatory Visit: Payer: Self-pay

## 2020-03-17 VITALS — BP 142/82 | HR 91 | Temp 94.0°F | Resp 17 | Ht 65.0 in | Wt 146.8 lb

## 2020-03-17 VITALS — BP 142/82 | HR 91 | Temp 97.6°F | Ht 65.0 in | Wt 146.8 lb

## 2020-03-17 DIAGNOSIS — J309 Allergic rhinitis, unspecified: Secondary | ICD-10-CM

## 2020-03-17 DIAGNOSIS — M85852 Other specified disorders of bone density and structure, left thigh: Secondary | ICD-10-CM

## 2020-03-17 DIAGNOSIS — Z0000 Encounter for general adult medical examination without abnormal findings: Secondary | ICD-10-CM

## 2020-03-17 DIAGNOSIS — Z1231 Encounter for screening mammogram for malignant neoplasm of breast: Secondary | ICD-10-CM

## 2020-03-17 DIAGNOSIS — R030 Elevated blood-pressure reading, without diagnosis of hypertension: Secondary | ICD-10-CM

## 2020-03-17 DIAGNOSIS — E2839 Other primary ovarian failure: Secondary | ICD-10-CM

## 2020-03-17 LAB — CBC WITH DIFFERENTIAL/PLATELET
Basophils Absolute: 0.1 10*3/uL (ref 0.0–0.1)
Basophils Relative: 1.2 % (ref 0.0–3.0)
Eosinophils Absolute: 0.1 10*3/uL (ref 0.0–0.7)
Eosinophils Relative: 1.4 % (ref 0.0–5.0)
HCT: 40.7 % (ref 36.0–46.0)
Hemoglobin: 13.6 g/dL (ref 12.0–15.0)
Lymphocytes Relative: 28.9 % (ref 12.0–46.0)
Lymphs Abs: 1.7 10*3/uL (ref 0.7–4.0)
MCHC: 33.4 g/dL (ref 30.0–36.0)
MCV: 88.1 fl (ref 78.0–100.0)
Monocytes Absolute: 0.3 10*3/uL (ref 0.1–1.0)
Monocytes Relative: 5.4 % (ref 3.0–12.0)
Neutro Abs: 3.8 10*3/uL (ref 1.4–7.7)
Neutrophils Relative %: 63.1 % (ref 43.0–77.0)
Platelets: 272 10*3/uL (ref 150.0–400.0)
RBC: 4.62 Mil/uL (ref 3.87–5.11)
RDW: 13.4 % (ref 11.5–15.5)
WBC: 6 10*3/uL (ref 4.0–10.5)

## 2020-03-17 LAB — COMPREHENSIVE METABOLIC PANEL
ALT: 50 U/L — ABNORMAL HIGH (ref 0–35)
AST: 24 U/L (ref 0–37)
Albumin: 4.7 g/dL (ref 3.5–5.2)
Alkaline Phosphatase: 97 U/L (ref 39–117)
BUN: 24 mg/dL — ABNORMAL HIGH (ref 6–23)
CO2: 30 mEq/L (ref 19–32)
Calcium: 9.2 mg/dL (ref 8.4–10.5)
Chloride: 103 mEq/L (ref 96–112)
Creatinine, Ser: 0.71 mg/dL (ref 0.40–1.20)
GFR: 81.29 mL/min (ref >60.00)
Glucose, Bld: 113 mg/dL — ABNORMAL HIGH (ref 70–99)
Potassium: 4.1 mEq/L (ref 3.5–5.1)
Sodium: 140 mEq/L (ref 135–145)
Total Bilirubin: 0.5 mg/dL (ref 0.2–1.2)
Total Protein: 7.3 g/dL (ref 6.0–8.3)

## 2020-03-17 LAB — LIPID PANEL
Cholesterol: 234 mg/dL — ABNORMAL HIGH (ref 0–200)
HDL: 42 mg/dL (ref >39.00)
NonHDL: 191.96
Total CHOL/HDL Ratio: 6
Triglycerides: 248 mg/dL — ABNORMAL HIGH (ref 0.0–149.0)
VLDL: 49.6 mg/dL — ABNORMAL HIGH (ref 0.0–40.0)

## 2020-03-17 LAB — LDL CHOLESTEROL, DIRECT: Direct LDL: 113 mg/dL

## 2020-03-17 LAB — TSH: TSH: 2.43 u[IU]/mL (ref 0.35–4.50)

## 2020-03-17 NOTE — Progress Notes (Signed)
Subjective  Chief Complaint  Patient presents with  . Annual Exam    fasting  . Headache    wakes up most mornings with headaches and dizziness    HPI: Denise Lin is a 69 y.o. female who presents to [facility] today for a Female Wellness Visit. She also has the concerns and/or needs as listed above in the chief complaint. These will be addressed in addition to the Health Maintenance Visit.   Wellness Visit: annual visit with health maintenance review and exam without Pap   HM: last cpe 09/2018; never had mammo or dexa when ordered 2019. Says she will go now. CRC screen up to date. Needs eye exam.  Chronic disease f/u and/or acute problem visit: (deemed necessary to be done in addition to the wellness visit):  ROS: positive for  dizziness "for awhile".+ headaches intermittently. No visual changes. No chest pain.   Assessment  1. Annual physical exam   2. Chronic allergic rhinitis   3. Osteopenia of left hip   4. Encounter for screening mammogram for breast cancer   5. Hypoestrogenism   6. Elevated blood pressure reading without diagnosis of hypertension      Plan  Female Wellness Visit:  Age appropriate Health Maintenance and Prevention measures were discussed with patient. Included topics are cancer screening recommendations, ways to keep healthy (see AVS) including dietary and exercise recommendations, regular eye and dental care, use of seat belts, and avoidance of moderate alcohol use and tobacco use. Educated on importance of annual mammograms. Reordered today. rec dexa to f/u osteopenia as well.   BMI: discussed patient's BMI and encouraged positive lifestyle modifications to help get to or maintain a target BMI.  HM needs and immunizations were addressed and ordered. See below for orders. See HM and immunization section for updates.  Routine labs and screening tests ordered including cmp, cbc and lipids where appropriate.  Discussed  recommendations regarding Vit D and calcium supplementation (see AVS)  Chronic disease management visit and/or acute problem visit:  BP was elevated: in part likely due to white coat component. Recommend home BP monitoring. Pt will purchase a cuff. Return in 4 weeks for recheck.   rec OV to recheck headaches at that time.   Follow up: Return in about 4 weeks (around 04/14/2020) for recheck blood pressure and headaches.  Orders Placed This Encounter  Procedures  . MM DIGITAL SCREENING BILATERAL  . DG Bone Density  . CBC with Differential/Platelet  . Comprehensive metabolic panel  . Lipid panel  . TSH   No orders of the defined types were placed in this encounter.     Lifestyle: Body mass index is 24.43 kg/m. Wt Readings from Last 3 Encounters:  03/17/20 146 lb 12.8 oz (66.6 kg)  10/23/18 145 lb 9.6 oz (66 kg)  09/27/18 146 lb 6.4 oz (66.4 kg)    Patient Active Problem List   Diagnosis Date Noted  . Chronic allergic rhinitis 09/27/2018  . 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 Dry eyes 08/28/2018  . Fear of side effects of medication 08/28/2018  . Vitamin D deficiency 11/06/2015  . Osteopenia 11/05/2015    Dexa 10/2015 lowest T = - 1.7 left hip, nl spine. Osteopenia.    . Bell's palsy 09/26/2015  . 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 Followed by [facility]. Benign and stable. (vs fibroadenoma). Never biopsied    Health Maintenance  Topic Date Due  . MAMMOGRAM  11/06/2016  . DEXA SCAN  09/25/2017  . INFLUENZA VACCINE  06/08/2020  . COLONOSCOPY  11/08/2024  . COVID-19 Vaccine  Completed  . Hepatitis C Screening  Completed  . PNA vac Low Risk Adult  Completed  . TETANUS/TDAP  Discontinued   Immunization History  Administered Date(s) Administered  . Influenza, High Dose Seasonal PF 12/15/2016, 08/28/2018  . Influenza,inj,Quad PF,6+ Mos 09/26/2015    . Moderna SARS-COVID-2 Vaccination 02/12/2020, 03/11/2020  . Pneumococcal Polysaccharide-23 09/27/2018   We updated and reviewed the patient's past history in detail and it is documented below. Allergies: Patient has No Known Allergies. Past Medical History Patient  has a past medical history of Anemia, Angiomyolipoma of kidney (08/28/2018), Bell's palsy, Endometriosis, GERD (gastroesophageal reflux disease), Hematuria, Osteopenia, Seizures ([place]), and Uterine polyp. Past Surgical History Patient  has a past surgical history that includes Uterine fibroid surgery; Nose surgery; Appendectomy; Dilation and curettage of uterus; Breast surgery; and Robotic assited partial nephrectomy (Left, 07/19/2014). Family History: Patient family history includes COPD in her maternal grandfather and mother; Colon cancer (age of onset: 23) in her paternal grandfather; Diabetes in her maternal grandfather; Drug abuse in her brother; Heart failure in her paternal grandmother; Hypertension in her father and son; Lung cancer in her mother; Obesity in her son; Pancreatic cancer in her maternal grandfather; Parkinson's disease in her father; Squamous cell carcinoma in her father; Suicidality in her brother. Social History:  Patient  reports that she has quit smoking. She has never used smokeless tobacco. She reports current alcohol use. She reports that she does not use drugs.  Review of Systems: Constitutional: negative for fever or malaise Ophthalmic: negative for photophobia, double vision or loss of vision Cardiovascular: negative for chest pain, dyspnea on exertion, or new LE swelling Respiratory: negative for SOB or persistent cough Gastrointestinal: negative for abdominal pain, change in bowel habits or melena Genitourinary: negative for dysuria or gross hematuria, no abnormal uterine bleeding or disharge Musculoskeletal: negative for new gait disturbance or muscular weakness Integumentary: negative for new or  persistent rashes, no breast lumps Neurological: negative for TIA or stroke symptoms Psychiatric: negative for SI or delusions Allergic/Immunologic: negative for hives  Patient Care Team    Relationship Specialty Notifications Start End  Leamon Arnt, MD PCP - General Family Medicine  08/28/18     Objective  Vitals: BP (!) 142/82 Comment: repeated by C. Javier Glazier, RN, for AWV  Pulse 91   Temp (!) 94 F (34.4 C) (Temporal)   Resp 17   Ht 5\' 5"  (1.651 m)   Wt 146 lb 12.8 oz (66.6 kg)   SpO2 98%   BMI 24.43 kg/m  General:  Well developed, well nourished, no acute distress  Psych:  Alert and orientedx3, nervous mood and affect HEENT:  Normocephalic, atraumatic, non-icteric sclera,  supple neck without adenopathy, mass or thyromegaly Cardiovascular:  Normal S1, S2, RRR without gallop, rub or murmur Respiratory:  Good breath sounds bilaterally, CTAB with normal respiratory effort Gastrointestinal: normal bowel sounds, soft, non-tender, no noted masses. No HSM MSK: no deformities, contusions. Joints are without erythema or swelling.  Skin:  Warm, no rashes or suspicious lesions noted Neurologic:    Mental status is normal. Gross motor and sensory exams are normal. Normal gait. No tremor Breast Exam: No mass, skin retraction or nipple discharge is appreciated in either breast. No axillary adenopathy. Fibrocystic  changes are not noted    Commons side effects, risks, benefits, and alternatives for medications and treatment plan prescribed today were discussed, and the patient expressed understanding of the given instructions. Patient is instructed to call or message via MyChart if he/she has any questions or concerns regarding our treatment plan. No barriers to understanding were identified. We discussed Red Flag symptoms and signs in detail. Patient expressed understanding regarding what to do in case of urgent or emergency type symptoms.   Medication list was reconciled, printed and  provided to the patient in AVS. Patient instructions and summary information was reviewed with the patient as documented in the AVS. This note was prepared with assistance of Dragon voice recognition software. Occasional wrong-word or sound-a-like substitutions may have occurred due to the inherent limitations of voice recognition software  This visit occurred during the SARS-CoV-2 public health emergency.  Safety protocols were in place, including screening questions prior to the visit, additional usage of staff PPE, and extensive cleaning of exam room while observing appropriate contact time as indicated for disinfecting solutions.

## 2020-03-17 NOTE — Patient Instructions (Signed)
Denise Lin , Thank you for taking time to come for your Medicare Wellness Visit. I appreciate your ongoing commitment to your health goals. Please review the following plan we discussed and let me know if I can assist you in the future.   Screening recommendations/referrals: Colorectal Screening: up to date; last 2016 Mammogram: ordered today Bone Density: ordered today   Vision and Dental Exams: Recommended annual ophthalmology exams for early detection of glaucoma and other disorders of the eye Recommended annual dental exams for proper oral hygiene  Vaccinations: Influenza vaccine: recommended yearly Pneumococcal vaccine: up to date; last 09/27/18  Tdap vaccine: recommended every 10 years; Please call your insurance company to determine your out of pocket expense. You also receive this vaccine at your local pharmacy or Health Dept. Shingles vaccine:You may receive this vaccine at your local pharmacy. (see handout)  Covid vaccine: completed   Advanced directives: Please bring a copy of your POA (Power of Attorney) and/or Living Will to your next appointment.  Goals: Recommend to drink at least 6-8 8oz glasses of water per day and consume a balanced diet rich in fresh fruits and vegetables.   Next appointment: Please schedule your Annual Wellness Visit with your Nurse Health Advisor in one year.  Preventive Care 2 Years and Older, Female Preventive care refers to lifestyle choices and visits with your health care provider that can promote health and wellness. What does preventive care include?  A yearly physical exam. This is also called an annual well check.  Dental exams once or twice a year.  Routine eye exams. Ask your health care provider how often you should have your eyes checked.  Personal lifestyle choices, including:  Daily care of your teeth and gums.  Regular physical activity.  Eating a healthy diet.  Avoiding tobacco and drug use.  Limiting alcohol  use.  Practicing safe sex.  Taking low-dose aspirin every day if recommended by your health care provider.  Taking vitamin and mineral supplements as recommended by your health care provider. What happens during an annual well check? The services and screenings done by your health care provider during your annual well check will depend on your age, overall health, lifestyle risk factors, and family history of disease. Counseling  Your health care provider may ask you questions about your:  Alcohol use.  Tobacco use.  Drug use.  Emotional well-being.  Home and relationship well-being.  Sexual activity.  Eating habits.  History of falls.  Memory and ability to understand (cognition).  Work and work Statistician.  Reproductive health. Screening  You may have the following tests or measurements:  Height, weight, and BMI.  Blood pressure.  Lipid and cholesterol levels. These may be checked every 5 years, or more frequently if you are over 45 years old.  Skin check.  Lung cancer screening. You may have this screening every year starting at age 54 if you have a 30-pack-year history of smoking and currently smoke or have quit within the past 15 years.  Fecal occult blood test (FOBT) of the stool. You may have this test every year starting at age 25.  Flexible sigmoidoscopy or colonoscopy. You may have a sigmoidoscopy every 5 years or a colonoscopy every 10 years starting at age 30.  Hepatitis C blood test.  Hepatitis B blood test.  Sexually transmitted disease (STD) testing.  Diabetes screening. This is done by checking your blood sugar (glucose) after you have not eaten for a while (fasting). You may have this done  every 1-3 years.  Bone density scan. This is done to screen for osteoporosis. You may have this done starting at age 51.  Mammogram. This may be done every 1-2 years. Talk to your health care provider about how often you should have regular  mammograms. Talk with your health care provider about your test results, treatment options, and if necessary, the need for more tests. Vaccines  Your health care provider may recommend certain vaccines, such as:  Influenza vaccine. This is recommended every year.  Tetanus, diphtheria, and acellular pertussis (Tdap, Td) vaccine. You may need a Td booster every 10 years.  Zoster vaccine. You may need this after age 5.  Pneumococcal 13-valent conjugate (PCV13) vaccine. One dose is recommended after age 41.  Pneumococcal polysaccharide (PPSV23) vaccine. One dose is recommended after age 75. Talk to your health care provider about which screenings and vaccines you need and how often you need them. This information is not intended to replace advice given to you by your health care provider. Make sure you discuss any questions you have with your health care provider. Document Released: 11/21/2015 Document Revised: 07/14/2016 Document Reviewed: 08/26/2015 Elsevier Interactive Patient Education  2017 [place] Prevention in the Home Falls can cause injuries. They can happen to people of all ages. There are many things you can do to make your home safe and to help prevent falls. What can I do on the outside of my home?  Regularly fix the edges of walkways and driveways and fix any cracks.  Remove anything that might make you trip as you walk through a door, such as a raised step or threshold.  Trim any bushes or trees on the path to your home.  Use bright outdoor lighting.  Clear any walking paths of anything that might make someone trip, such as rocks or tools.  Regularly check to see if handrails are loose or broken. Make sure that both sides of any steps have handrails.  Any raised decks and porches should have guardrails on the edges.  Have any leaves, snow, or ice cleared regularly.  Use sand or salt on walking paths during winter.  Clean up any spills in your garage  right away. This includes oil or grease spills. What can I do in the bathroom?  Use night lights.  Install grab bars by the toilet and in the tub and shower. Do not use towel bars as grab bars.  Use non-skid mats or decals in the tub or shower.  If you need to sit down in the shower, use a plastic, non-slip stool.  Keep the floor dry. Clean up any water that spills on the floor as soon as it happens.  Remove soap buildup in the tub or shower regularly.  Attach bath mats securely with double-sided non-slip rug tape.  Do not have throw rugs and other things on the floor that can make you trip. What can I do in the bedroom?  Use night lights.  Make sure that you have a light by your bed that is easy to reach.  Do not use any sheets or blankets that are too big for your bed. They should not hang down onto the floor.  Have a firm chair that has side arms. You can use this for support while you get dressed.  Do not have throw rugs and other things on the floor that can make you trip. What can I do in the kitchen?  Clean up any spills right  away.  Avoid walking on wet floors.  Keep items that you use a lot in easy-to-reach places.  If you need to reach something above you, use a strong step stool that has a grab bar.  Keep electrical cords out of the way.  Do not use floor polish or wax that makes floors slippery. If you must use wax, use non-skid floor wax.  Do not have throw rugs and other things on the floor that can make you trip. What can I do with my stairs?  Do not leave any items on the stairs.  Make sure that there are handrails on both sides of the stairs and use them. Fix handrails that are broken or loose. Make sure that handrails are as long as the stairways.  Check any carpeting to make sure that it is firmly attached to the stairs. Fix any carpet that is loose or worn.  Avoid having throw rugs at the top or bottom of the stairs. If you do have throw rugs,  attach them to the floor with carpet tape.  Make sure that you have a light switch at the top of the stairs and the bottom of the stairs. If you do not have them, ask someone to add them for you. What else can I do to help prevent falls?  Wear shoes that:  Do not have high heels.  Have rubber bottoms.  Are comfortable and fit you well.  Are closed at the toe. Do not wear sandals.  If you use a stepladder:  Make sure that it is fully opened. Do not climb a closed stepladder.  Make sure that both sides of the stepladder are locked into place.  Ask someone to hold it for you, if possible.  Clearly mark and make sure that you can see:  Any grab bars or handrails.  First and last steps.  Where the edge of each step is.  Use tools that help you move around (mobility aids) if they are needed. These include:  Canes.  Walkers.  Scooters.  Crutches.  Turn on the lights when you go into a dark area. Replace any light bulbs as soon as they burn out.  Set up your furniture so you have a clear path. Avoid moving your furniture around.  If any of your floors are uneven, fix them.  If there are any pets around you, be aware of where they are.  Review your medicines with your doctor. Some medicines can make you feel dizzy. This can increase your chance of falling. Ask your doctor what other things that you can do to help prevent falls. This information is not intended to replace advice given to you by your health care provider. Make sure you discuss any questions you have with your health care provider. Document Released: 08/21/2009 Document Revised: 04/01/2016 Document Reviewed: 11/29/2014 Elsevier Interactive Patient Education  2017 [employer].

## 2020-03-17 NOTE — Patient Instructions (Addendum)
Please return in 4 weeks to recheck your blood pressure and discuss your headaches. Please bring in your home bp readings.  We want your blood pressure to be < 140/90 to be in the normal range.   I recommend having your eyes examined at an eye doctor's office once a year.   I have ordered a mammogram and/or bone density for you as we discussed today: [x]   Mammogram  [x]   Bone Density  Please call the office checked below to schedule your appointment: Your appointment will at the following location  [x]   [facility]      [place], [place]         []   [facility]  [place], [place]  Make sure to wear two peace clothing  No lotions powders or deodorants the day of the appointment Make sure to bring picture ID and insurance card.  Bring list of medications you are currently taking including any supplements.    If you have any questions or concerns, please don't hesitate to send me a message via MyChart or call the office at [phone]. Thank you for visiting with [place] today! It's our pleasure caring for you.

## 2020-03-17 NOTE — Progress Notes (Signed)
Subjective:   Denise Lin is a 69 y.o. female who presents for an Initial Medicare Annual Wellness Visit.  Review of Systems     Cardiac Risk Factors include: advanced age (>4men, >19 women);dyslipidemia    Objective:    Today's Vitals   03/17/20 0849  BP: (!) 142/82  Pulse: 91  Temp: 97.6 F (36.4 C)  TempSrc: Temporal  SpO2: 98%  Weight: 146 lb 13.2 oz (66.6 kg)  Height: 5\' 5"  (1.651 m)   Body mass index is 24.43 kg/m.  Advanced Directives 03/17/2020 07/19/2014 07/19/2014 07/11/2014  Does Patient Have a Medical Advance Directive? Yes Yes - Yes  Type of Advance Directive Living will;Healthcare Power of [place];Living will  Does patient want to make changes to medical advance directive? No - Patient declined No - Patient declined No - Patient declined No - Patient declined  Copy of [place] in Chart? No - copy requested Yes - No - copy requested    Current Medications (verified) Outpatient Encounter Medications as of 03/17/2020  Medication Sig  . fluticasone (FLONASE) 50 MCG/ACT nasal spray Place 1 spray into both nostrils daily. (Patient not taking: Reported on 03/17/2020)   No facility-administered encounter medications on file as of 03/17/2020.    Allergies (verified) Patient has no known allergies.   History: Past Medical History:  Diagnosis Date  . Anemia    with endometriosis   . 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 Bell's palsy    recent   . Endometriosis   . GERD (gastroesophageal reflux disease)   . Hematuria    recent   . Osteopenia   . Seizures ([place])    as an infant no problems since   . Uterine polyp    Past Surgical History:  Procedure Laterality Date  . APPENDECTOMY    . BREAST SURGERY     benign tumor removed right breast   . DILATION AND CURETTAGE OF UTERUS     x 2 for endomtriosis and polyp  . NOSE SURGERY    .  ROBOTIC ASSITED PARTIAL NEPHRECTOMY Left 07/19/2014   Procedure: ROBOTIC ASSITED PARTIAL NEPHRECTOMY, LAPAROSCOPIC ADHESIOLYSIS;  Surgeon: Alexis Frock, MD;  Location: [facility];  Service: Urology;  Laterality: Left;  . UTERINE FIBROID SURGERY     Family History  Problem Relation Age of Onset  . Parkinson's disease Father   . Hypertension Father   . Squamous cell carcinoma Father        skin  . Heart failure Paternal Grandmother   . Colon cancer Paternal Grandfather 8  . Lung cancer Mother   . COPD Mother   . Suicidality Brother   . Drug abuse Brother   . Obesity Son   . Hypertension Son   . COPD Maternal Grandfather   . Diabetes Maternal Grandfather   . Pancreatic cancer Maternal Grandfather    Social History   Socioeconomic History  . Marital status: Married    Spouse name: Not on file  . Number of children: 2  . Years of education: Not on file  . Highest education level: Not on file  Occupational History  . Occupation: Retired Scientist, research (medical), Teacher, adult education, Passenger transport manager and others    Comment: retired in 2011  Tobacco Use  . Smoking status: Former Research scientist (life sciences)  . Smokeless tobacco: Never Used  Substance and Sexual Activity  . Alcohol use: Yes    Comment:  1-2 glasses of wine daily   . Drug use: No  . Sexual activity: Yes    Birth control/protection: Post-menopausal  Other Topics Concern  . Not on file  Social History Narrative  . Not on file   Social Determinants of Health   Financial Resource Strain:   . Difficulty of Paying Living Expenses:   Food Insecurity:   . Worried About Charity fundraiser in the Last Year:   . Arboriculturist in the Last Year:   Transportation Needs:   . Film/video editor (Medical):   Marland Kitchen Lack of Transportation (Non-Medical):   Physical Activity:   . Days of Exercise per Week:   . Minutes of Exercise per Session:   Stress:   . Feeling of Stress :   Social Connections:   . Frequency of Communication with Friends and Family:   . Frequency of Social  Gatherings with Friends and Family:   . Attends Religious Services:   . Active Member of Clubs or Organizations:   . Attends Archivist Meetings:   Marland Kitchen Marital Status:     Tobacco Counseling Counseling given: Not Answered   Clinical Intake:  Pre-visit preparation completed: Yes  Pain : No/denies pain  Diabetes: No  How often do you need to have someone help you when you read instructions, pamphlets, or other written materials from your doctor or pharmacy?: 1 - Never  Interpreter Needed?: No  Information entered by :: Denman George LPN   Activities of Daily Living In your present state of health, do you have any difficulty performing the following activities: 03/17/2020 03/17/2020  Hearing? N N  Vision? N N  Difficulty concentrating or making decisions? N N  Walking or climbing stairs? N N  Dressing or bathing? N N  Doing errands, shopping? N N  Preparing Food and eating ? N -  Using the Toilet? N -  In the past six months, have you accidently leaked urine? N -  Do you have problems with loss of bowel control? N -  Managing your Medications? N -  Managing your Finances? N -  Housekeeping or managing your Housekeeping? N -  Some recent data might be hidden     Immunizations and Health Maintenance Immunization History  Administered Date(s) Administered  . Influenza, High Dose Seasonal PF 12/15/2016, 08/28/2018  . Influenza,inj,Quad PF,6+ Mos 09/26/2015  . Moderna SARS-COVID-2 Vaccination 02/12/2020, 03/11/2020  . Pneumococcal Polysaccharide-23 09/27/2018   Health Maintenance Due  Topic Date Due  . MAMMOGRAM  11/06/2016  . DEXA SCAN  09/25/2017    Patient Care Team: Leamon Arnt, MD as PCP - General (Family Medicine)  Indicate any recent Medical Services you may have received from other than [facility] providers in the past year (date may be approximate).     Assessment:   This is a routine wellness examination for [facility].  Hearing/Vision screen No  exam data present  Dietary issues and exercise activities discussed: Current Exercise Habits: Home exercise routine;Structured exercise class, Type of exercise: walking;yoga;Other - see comments(bowling), Time (Minutes): 30, Frequency (Times/Week): 5, Weekly Exercise (Minutes/Week): 150, Intensity: Mild  Goals   None    Depression Screen PHQ 2/9 Scores 03/17/2020 03/17/2020 09/27/2018 08/12/2016 09/26/2015  PHQ - 2 Score 0 0 0 0 0    Fall Risk Fall Risk  03/17/2020 03/17/2020 09/27/2018 08/12/2016 09/26/2015  Falls in the past year? 0 0 0 No No  Number falls in past yr: 0 0 0 - -  Injury with Fall? 0 0 - - -  Follow up Falls evaluation completed;Education provided;Falls prevention discussed - - - -    Is the patient's home free of loose throw rugs in walkways, pet beds, electrical cords, etc?   yes      Grab bars in the bathroom? yes      Handrails on the stairs?   yes      Adequate lighting?   yes  Timed Get Up and Go Performed completed and within normal timeframe; no gait abnormalities noted   Cognitive Function: no cognitive concerns at this time    6CIT Screen 03/17/2020  What Year? 0 points  What month? 0 points  What time? 0 points  Count back from 20 0 points  Months in reverse 0 points  Repeat phrase 0 points  Total Score 0    Screening Tests Health Maintenance  Topic Date Due  . MAMMOGRAM  11/06/2016  . DEXA SCAN  09/25/2017  . INFLUENZA VACCINE  06/08/2020  . COLONOSCOPY  11/08/2024  . COVID-19 Vaccine  Completed  . Hepatitis C Screening  Completed  . PNA vac Low Risk Adult  Completed  . TETANUS/TDAP  Discontinued    Qualifies for Shingles Vaccine? Discussed and patient will check with pharmacy for coverage.  Patient education handout provided   Cancer Screenings: Lung: Low Dose CT Chest recommended if Age 48-80 years, 30 pack-year currently smoking OR have quit w/in 15years. Patient does not qualify. Breast: Up to date on Mammogram? No; ordered today     Up to date of Bone Density/Dexa? No; ordered today  Colorectal: up to date; last 2016   Plan:  I have personally reviewed and addressed the Medicare Annual Wellness questionnaire and have noted the following in the patient's chart:  A. Medical and social history B. Use of alcohol, tobacco or illicit drugs  C. Current medications and supplements D. Functional ability and status E.  Nutritional status F.  Physical activity G. Advance directives H. List of other physicians I.  Hospitalizations, surgeries, and ER visits in previous 12 months J.  [place] such as hearing and vision if needed, cognitive and depression L. Referrals, records requested, and appointments- mammogram and dexa ordered   In addition, I have reviewed and discussed with patient certain preventive protocols, quality metrics, and best practice recommendations. A written personalized care plan for preventive services as well as general preventive health recommendations were provided to patient.   Signed,  Denman George, LPN  Nurse Health Advisor   Nurse Notes: no additional

## 2020-04-11 ENCOUNTER — Other Ambulatory Visit: Payer: Self-pay

## 2020-04-14 ENCOUNTER — Encounter: Payer: Self-pay | Admitting: Family Medicine

## 2020-04-14 ENCOUNTER — Ambulatory Visit (INDEPENDENT_AMBULATORY_CARE_PROVIDER_SITE_OTHER): Payer: Medicare Other | Admitting: Family Medicine

## 2020-04-14 ENCOUNTER — Other Ambulatory Visit: Payer: Self-pay

## 2020-04-14 VITALS — BP 150/78 | HR 97 | Temp 98.0°F | Resp 16 | Ht 65.0 in | Wt 145.0 lb

## 2020-04-14 DIAGNOSIS — E782 Mixed hyperlipidemia: Secondary | ICD-10-CM

## 2020-04-14 DIAGNOSIS — R197 Diarrhea, unspecified: Secondary | ICD-10-CM | POA: Diagnosis not present

## 2020-04-14 DIAGNOSIS — I10 Essential (primary) hypertension: Secondary | ICD-10-CM | POA: Insufficient documentation

## 2020-04-14 MED ORDER — HYDROCHLOROTHIAZIDE 12.5 MG PO CAPS: 12.5000 mg | ORAL_CAPSULE | Freq: Every day | ORAL | 3 refills | Status: DC

## 2020-04-14 NOTE — Progress Notes (Signed)
Subjective  CC:  Chief Complaint  Patient presents with  . blood pressure recheck    not checking BP daily, average 149/88, concerned that her BP began to increase after starting a multi-vitamin from [facility]   . Headache    improved, has not recently had any - patient thinks increased preservative intake were causing headaches   . Diarrhea    started about two weeks ago, recently started [employer] - has helped with diarrhea     HPI: Denise Lin is a 69 y.o. female who presents to the office today to address the problems listed above in the chief complaint.  Blood pressure f/u: first time elevated readings 6 weeks ago in office: home readings confirm mildly elevated. See above. Feels fine. No cp or sob or le edema. We reviewed elevated ASCVD risk score.  Headaches: resolved. See above  2 weeks, now improving waterry diarrhea. No abdominal pain, cramping, blood or mucous. Thinks it was related to a salad that she ate. No formal dx of IBS but reports rarely, she will have this happen to her. Otherwise feels fine and today starting to have more formed stools.  Assessment  1. Essential hypertension   2. Diarrhea, unspecified type   3. Mixed hyperlipidemia      Plan    Hypertension f/u: BP control is fairly well controlled. New dx: start low dose hctz. She is hesitant to start meds so will go slowly. Continue home readings. See HO for education.   Hyperlipidemia f/u: consider starting statin in 3 months. Will only add one medication at a time.   Diarrhea: ? IBS flare. Improving. Will monitor. Pt will return if does not resolve.   Education regarding management of these chronic disease states was given. Management strategies discussed on successive visits include dietary and exercise recommendations, goals of achieving and maintaining IBW, and lifestyle modifications aiming for adequate sleep and minimizing stressors.   Follow up: 3 months for HTN recheck. Consider starting statin  No  orders of the defined types were placed in this encounter.  Meds ordered this encounter  Medications  . hydrochlorothiazide (MICROZIDE) 12.5 MG capsule    Sig: Take 1 capsule (12.5 mg total) by mouth daily.    Dispense:  90 capsule    Refill:  3      BP Readings from Last 3 Encounters:  04/14/20 (!) 150/78  03/17/20 (!) 142/82  03/17/20 (!) 142/82   Wt Readings from Last 3 Encounters:  04/14/20 145 lb (65.8 kg)  03/17/20 146 lb 13.2 oz (66.6 kg)  03/17/20 146 lb 12.8 oz (66.6 kg)    Lab Results  Component Value Date   CHOL 234 (H) 03/17/2020   CHOL 231 (H) 09/27/2018   CHOL 219 (H) 09/26/2015   Lab Results  Component Value Date   HDL 42.00 03/17/2020   HDL 47.30 09/27/2018   HDL 49 09/26/2015   Lab Results  Component Value Date   LDLCALC 142 (H) 09/26/2015   Lab Results  Component Value Date   TRIG 248.0 (H) 03/17/2020   TRIG 232.0 (H) 09/27/2018   TRIG 138 09/26/2015   Lab Results  Component Value Date   CHOLHDL 6 03/17/2020   CHOLHDL 5 09/27/2018   CHOLHDL 4.5 (H) 09/26/2015   Lab Results  Component Value Date   LDLDIRECT 113.0 03/17/2020   LDLDIRECT 130.0 09/27/2018   Lab Results  Component Value Date   CREATININE 0.71 03/17/2020   BUN 24 (H) 03/17/2020  NA 140 03/17/2020   K 4.1 03/17/2020   CL 103 03/17/2020   CO2 30 03/17/2020    The 10-year ASCVD risk score Mikey Bussing DC Jr., et al., 2013) is: 18.6%   Values used to calculate the score:     Age: 6 years     Sex: Female     Is Non-Hispanic African American: No     Diabetic: No     Tobacco smoker: No     Systolic Blood Pressure: 696 mmHg     Is BP treated: Yes     HDL Cholesterol: 42 mg/dL     Total Cholesterol: 234 mg/dL  I reviewed the patients updated PMH, FH, and SocHx.    Patient Active Problem List   Diagnosis Date Noted  . Essential hypertension 04/14/2020  . Mixed hyperlipidemia 04/14/2020  . Chronic allergic rhinitis 09/27/2018  . 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  Dry eyes 08/28/2018  . Fear of side effects of medication 08/28/2018  . Vitamin D deficiency 11/06/2015  . Osteopenia 11/05/2015  . Bell's palsy 09/26/2015  . 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  Social History: Patient  reports that she has quit smoking. She has never used smokeless tobacco. She reports current alcohol use. She reports that she does not use drugs.  Current Meds  Medication Sig  . Multiple Vitamin (MULTIVITAMIN ADULT PO) Take by mouth. Women's 50+ multivitamin from [facility]    Review of Systems: Cardiovascular: negative for chest pain, palpitations, leg swelling, orthopnea Respiratory: negative for SOB, wheezing or persistent cough Gastrointestinal: negative for abdominal pain Genitourinary: negative for dysuria or gross hematuria  Objective  Vitals: BP (!) 150/78   Pulse 97   Temp 98 F (36.7 C) (Temporal)   Resp 16   Ht 5\' 5"  (1.651 m)   Wt 145 lb (65.8 kg)   SpO2 98%   BMI 24.13 kg/m  General: no acute distress  Psych:  Alert and oriented, normal mood and affect HEENT:  Normocephalic, atraumatic, supple neck  Cardiovascular:  RRR without murmur. no edema Respiratory:  Good breath sounds bilaterally, CTAB with normal respiratory effort Abd: soft and nontender Skin:  Warm, no rashes   Commons side effects, risks, benefits, and alternatives for medications and treatment plan prescribed today were discussed, and the patient expressed understanding of the given instructions. Patient is instructed to call or message via MyChart if he/she has any questions or concerns regarding our treatment plan. No barriers to understanding were identified. We discussed Red Flag symptoms and signs in detail. Patient expressed understanding regarding what to do in case of urgent or emergency type symptoms.   Medication list was reconciled, printed and provided to the patient in AVS. Patient instructions and summary  information was reviewed with the patient as documented in the AVS. This note was prepared with assistance of Dragon voice recognition software. Occasional wrong-word or sound-a-like substitutions may have occurred due to the inherent limitations of voice recognition software  This visit occurred during the SARS-CoV-2 public health emergency.  Safety protocols were in place, including screening questions prior to the visit, additional usage of staff PPE, and extensive cleaning of exam room while observing appropriate contact time as indicated for disinfecting solutions.

## 2020-04-14 NOTE — Patient Instructions (Signed)
Please return in 3 months  for hypertension follow up.  Please start the low dose fluid pill once daily to help improve your blood pressure. Keep checking it at home as well. Eat a low salt diet and call me if your diarrhea does not resolve completely.   If you have any questions or concerns, please don't hesitate to send me a message via MyChart or call the office at [phone]. Thank you for visiting with [place] today! It's our pleasure caring for you.   Hypertension, Adult High blood pressure (hypertension) is when the force of blood pumping through the arteries is too strong. The arteries are the blood vessels that carry blood from the heart throughout the body. Hypertension forces the heart to work harder to pump blood and may cause arteries to become narrow or stiff. Untreated or uncontrolled hypertension can cause a heart attack, heart failure, a stroke, kidney disease, and other problems. A blood pressure reading consists of a higher number over a lower number. Ideally, your blood pressure should be below 120/80. The first ("top") number is called the systolic pressure. It is a measure of the pressure in your arteries as your heart beats. The second ("bottom") number is called the diastolic pressure. It is a measure of the pressure in your arteries as the heart relaxes. What are the causes? The exact cause of this condition is not known. There are some conditions that result in or are related to high blood pressure. What increases the risk? Some risk factors for high blood pressure are under your control. The following factors may make you more likely to develop this condition:  Smoking.  Having type 2 diabetes mellitus, high cholesterol, or both.  Not getting enough exercise or physical activity.  Being overweight.  Having too much fat, sugar, calories, or salt (sodium) in your diet.  Drinking too much alcohol. Some risk factors for high blood pressure may be difficult or impossible  to change. Some of these factors include:  Having chronic kidney disease.  Having a family history of high blood pressure.  Age. Risk increases with age.  Race. You may be at higher risk if you are African American.  Gender. Men are at higher risk than women before age 75. After age 74, women are at higher risk than men.  Having obstructive sleep apnea.  Stress. What are the signs or symptoms? High blood pressure may not cause symptoms. Very high blood pressure (hypertensive crisis) may cause:  Headache.  Anxiety.  Shortness of breath.  Nosebleed.  Nausea and vomiting.  Vision changes.  Severe chest pain.  Seizures. How is this diagnosed? This condition is diagnosed by measuring your blood pressure while you are seated, with your arm resting on a flat surface, your legs uncrossed, and your feet flat on the floor. The cuff of the blood pressure monitor will be placed directly against the skin of your upper arm at the level of your heart. It should be measured at least twice using the same arm. Certain conditions can cause a difference in blood pressure between your right and left arms. Certain factors can cause blood pressure readings to be lower or higher than normal for a short period of time:  When your blood pressure is higher when you are in a health care provider's office than when you are at home, this is called white coat hypertension. Most people with this condition do not need medicines.  When your blood pressure is higher at home than when  you are in a health care provider's office, this is called masked hypertension. Most people with this condition may need medicines to control blood pressure. If you have a high blood pressure reading during one visit or you have normal blood pressure with other risk factors, you may be asked to:  Return on a different day to have your blood pressure checked again.  Monitor your blood pressure at home for 1 week or longer. If  you are diagnosed with hypertension, you may have other blood or imaging tests to help your health care provider understand your overall risk for other conditions. How is this treated? This condition is treated by making healthy lifestyle changes, such as eating healthy foods, exercising more, and reducing your alcohol intake. Your health care provider may prescribe medicine if lifestyle changes are not enough to get your blood pressure under control, and if:  Your systolic blood pressure is above 130.  Your diastolic blood pressure is above 80. Your personal target blood pressure may vary depending on your medical conditions, your age, and other factors. Follow these instructions at home: Eating and drinking   Eat a diet that is high in fiber and potassium, and low in sodium, added sugar, and fat. An example eating plan is called the DASH (Dietary Approaches to Stop Hypertension) diet. To eat this way: ? Eat plenty of fresh fruits and vegetables. Try to fill one half of your plate at each meal with fruits and vegetables. ? Eat whole grains, such as whole-wheat pasta, brown rice, or whole-grain bread. Fill about one fourth of your plate with whole grains. ? Eat or drink low-fat dairy products, such as skim milk or low-fat yogurt. ? Avoid fatty cuts of meat, processed or cured meats, and poultry with skin. Fill about one fourth of your plate with lean proteins, such as fish, chicken without skin, beans, eggs, or tofu. ? Avoid pre-made and processed foods. These tend to be higher in sodium, added sugar, and fat.  Reduce your daily sodium intake. Most people with hypertension should eat less than 1,500 mg of sodium a day.  Do not drink alcohol if: ? Your health care provider tells you not to drink. ? You are pregnant, may be pregnant, or are planning to become pregnant.  If you drink alcohol: ? Limit how much you use to:  0-1 drink a day for women.  0-2 drinks a day for men. ? Be aware of  how much alcohol is in your drink. In the [place] one drink equals one 12 oz bottle of beer (355 mL), one 5 oz glass of wine (148 mL), or one 1 oz glass of hard liquor (44 mL). Lifestyle   Work with your health care provider to maintain a healthy body weight or to lose weight. Ask what an ideal weight is for you.  Get at least 30 minutes of exercise most days of the week. Activities may include walking, swimming, or biking.  Include exercise to strengthen your muscles (resistance exercise), such as Pilates or lifting weights, as part of your weekly exercise routine. Try to do these types of exercises for 30 minutes at least 3 days a week.  Do not use any products that contain nicotine or tobacco, such as cigarettes, e-cigarettes, and chewing tobacco. If you need help quitting, ask your health care provider.  Monitor your blood pressure at home as told by your health care provider.  Keep all follow-up visits as told by your health care provider. This  is important. Medicines  Take over-the-counter and prescription medicines only as told by your health care provider. Follow directions carefully. Blood pressure medicines must be taken as prescribed.  Do not skip doses of blood pressure medicine. Doing this puts you at risk for problems and can make the medicine less effective.  Ask your health care provider about side effects or reactions to medicines that you should watch for. Contact a health care provider if you:  Think you are having a reaction to a medicine you are taking.  Have headaches that keep coming back (recurring).  Feel dizzy.  Have swelling in your ankles.  Have trouble with your vision. Get help right away if you:  Develop a severe headache or confusion.  Have unusual weakness or numbness.  Feel faint.  Have severe pain in your chest or abdomen.  Vomit repeatedly.  Have trouble breathing. Summary  Hypertension is when the force of blood pumping through your  arteries is too strong. If this condition is not controlled, it may put you at risk for serious complications.  Your personal target blood pressure may vary depending on your medical conditions, your age, and other factors. For most people, a normal blood pressure is less than 120/80.  Hypertension is treated with lifestyle changes, medicines, or a combination of both. Lifestyle changes include losing weight, eating a healthy, low-sodium diet, exercising more, and limiting alcohol. This information is not intended to replace advice given to you by your health care provider. Make sure you discuss any questions you have with your health care provider. Document Revised: 07/05/2018 Document Reviewed: 07/05/2018 Elsevier Patient Education  2020 [employer].

## 2020-05-30 ENCOUNTER — Other Ambulatory Visit: Payer: Medicare Other

## 2020-05-30 ENCOUNTER — Other Ambulatory Visit: Payer: Self-pay | Admitting: Family Medicine

## 2020-05-30 ENCOUNTER — Ambulatory Visit
Admission: RE | Admit: 2020-05-30 | Discharge: 2020-05-30 | Disposition: A | Discharge status: Home / Self Care | Payer: Medicare Other | Source: Ambulatory Visit | Attending: Family Medicine | Admitting: Family Medicine

## 2020-05-30 ENCOUNTER — Other Ambulatory Visit: Payer: Self-pay

## 2020-05-30 DIAGNOSIS — M85852 Other specified disorders of bone density and structure, left thigh: Secondary | ICD-10-CM

## 2020-05-30 DIAGNOSIS — Z1231 Encounter for screening mammogram for malignant neoplasm of breast: Secondary | ICD-10-CM

## 2020-05-30 DIAGNOSIS — E2839 Other primary ovarian failure: Secondary | ICD-10-CM

## 2020-06-12 ENCOUNTER — Other Ambulatory Visit: Payer: Self-pay

## 2020-06-12 ENCOUNTER — Other Ambulatory Visit: Payer: Medicare Other

## 2020-06-12 DIAGNOSIS — Z20822 Contact with and (suspected) exposure to covid-19: Secondary | ICD-10-CM

## 2020-06-13 LAB — SARS-COV-2, NAA 2 DAY TAT

## 2020-06-13 LAB — NOVEL CORONAVIRUS, NAA: SARS-CoV-2, NAA: DETECTED — AB

## 2020-06-14 ENCOUNTER — Telehealth (HOSPITAL_COMMUNITY): Payer: Self-pay | Admitting: Oncology

## 2020-06-14 NOTE — Telephone Encounter (Signed)
Called to Discuss with patient about Covid symptoms and the use of regeneron, a monoclonal antibody infusion for those with mild to moderate Covid symptoms and at a high risk of hospitalization.     Pt is qualified for this infusion at the [facility] due to co-morbid conditions and/or a member of an at-risk group.     Spoke with patient and she is currently on day 8 of onset of symptoms. She is doing well and would does not think she will need MAB infusion. She was given the hotline number to call us back should her symptoms worsen.  Rulon Abide, NP, AGNP-C [phone] ([place])

## 2020-06-26 ENCOUNTER — Other Ambulatory Visit: Payer: Self-pay

## 2020-06-26 ENCOUNTER — Other Ambulatory Visit: Payer: Medicare Other

## 2020-06-26 DIAGNOSIS — Z20822 Contact with and (suspected) exposure to covid-19: Secondary | ICD-10-CM

## 2020-06-27 LAB — NOVEL CORONAVIRUS, NAA: SARS-CoV-2, NAA: NOT DETECTED

## 2020-06-27 LAB — SARS-COV-2, NAA 2 DAY TAT

## 2020-07-01 ENCOUNTER — Telehealth: Payer: Self-pay

## 2020-07-01 NOTE — Telephone Encounter (Signed)
A [facility] in [place] off [address]  One test negative on 8/19 and one test positive on 8/19  Patient states she is not having any symptoms

## 2020-07-01 NOTE — Telephone Encounter (Signed)
Pt was diagnosed with covid on the 5th. She went to be retested to be cleared and said that one of the tests came back negative and one came back on the 19th. Pt would like to know if there is anything else she can do to get better.

## 2020-07-01 NOTE — Telephone Encounter (Signed)
Please clarify: doesn't make sense.  What was resulted on the 19th. Where was she tested. How is she feeling etc.... Virtual visit if needed.

## 2020-07-01 NOTE — Telephone Encounter (Signed)
Please advise 

## 2020-07-01 NOTE — Telephone Encounter (Signed)
Thanks. Not sure why she was tested twice on the 19th but if her sxs have resolved, she is cleared.

## 2020-07-01 NOTE — Telephone Encounter (Signed)
Patient is requesting a letter stating that is she clear and not contagious so she can return to the [facility]. OK to write letter?

## 2020-07-02 NOTE — Telephone Encounter (Signed)
Yes, ok to write letter if no longer symptomatic and has been 14 days since initial positive.

## 2020-07-02 NOTE — Telephone Encounter (Signed)
Letter sent to patient via MyChart.

## 2020-07-18 ENCOUNTER — Ambulatory Visit (INDEPENDENT_AMBULATORY_CARE_PROVIDER_SITE_OTHER): Payer: Medicare Other | Admitting: Family Medicine

## 2020-07-18 ENCOUNTER — Encounter: Payer: Self-pay | Admitting: Family Medicine

## 2020-07-18 ENCOUNTER — Other Ambulatory Visit: Payer: Self-pay

## 2020-07-18 VITALS — BP 124/72 | HR 91 | Temp 98.0°F | Resp 18 | Ht 65.0 in | Wt 142.2 lb

## 2020-07-18 DIAGNOSIS — Z23 Encounter for immunization: Secondary | ICD-10-CM

## 2020-07-18 DIAGNOSIS — E782 Mixed hyperlipidemia: Secondary | ICD-10-CM

## 2020-07-18 DIAGNOSIS — I10 Essential (primary) hypertension: Secondary | ICD-10-CM | POA: Diagnosis not present

## 2020-07-18 NOTE — Progress Notes (Signed)
Subjective  CC:  Chief Complaint  Patient presents with  . Hypertension    Has only been taking the Hydrochlorothiazide for 2 weeks. She didnt take it when first prescribe due to her Googling the side effects of the medication. She does have her home BP cuff and readings.   . Health Maintenance    Unsure if she wants the flu shot in office today.    HPI: Denise Lin is a 69 y.o. female who presents to the office today to address the problems listed above in the chief complaint.  Hypertension f/u: Patient started medications 2 weeks ago.  She is hesitant to use medications and fearful of side effects.  However over the last 2 weeks she has done well with medications.  She checks her home readings consistently over the last 2 weeks and on average they are 120s over 70s.  She denies chest pain, shortness of breath or adverse effects.   Patient has some whitecoat hypertensive response.  Home readings are normal.  We compared her machine to our cuff today and it is similar.  She had mildly elevated readings today here in the office which is typical for her.  We will manage her medications by her home readings.  Hyperlipidemia with statin indicated.  Assessment  1. Essential hypertension   2. Mixed hyperlipidemia      Plan    Hypertension f/u: BP control is fairly well controlled.  Continue current dose of HCTZ and check blood pressures at home twice weekly.  Reassured  Hyperlipidemia f/u: Statin is indicated but patient defers for now.  We will keep working to see if we can get her on it in the future. Education regarding management of these chronic disease states was given. Management strategies discussed on successive visits include dietary and exercise recommendations, goals of achieving and maintaining IBW, and lifestyle modifications aiming for adequate sleep and minimizing stressors.   Follow up: Return in about 6 months (around 01/15/2021) for follow up Hypertension, follow up  hypercholesterolemia.  No orders of the defined types were placed in this encounter.  No orders of the defined types were placed in this encounter.     BP Readings from Last 3 Encounters:  07/18/20 124/72  04/14/20 (!) 150/78  03/17/20 (!) 142/82   Wt Readings from Last 3 Encounters:  07/18/20 142 lb 3.2 oz (64.5 kg)  04/14/20 145 lb (65.8 kg)  03/17/20 146 lb 13.2 oz (66.6 kg)    Lab Results  Component Value Date   CHOL 234 (H) 03/17/2020   CHOL 231 (H) 09/27/2018   CHOL 219 (H) 09/26/2015   Lab Results  Component Value Date   HDL 42.00 03/17/2020   HDL 47.30 09/27/2018   HDL 49 09/26/2015   Lab Results  Component Value Date   LDLCALC 142 (H) 09/26/2015   Lab Results  Component Value Date   TRIG 248.0 (H) 03/17/2020   TRIG 232.0 (H) 09/27/2018   TRIG 138 09/26/2015   Lab Results  Component Value Date   CHOLHDL 6 03/17/2020   CHOLHDL 5 09/27/2018   CHOLHDL 4.5 (H) 09/26/2015   Lab Results  Component Value Date   LDLDIRECT 113.0 03/17/2020   LDLDIRECT 130.0 09/27/2018   Lab Results  Component Value Date   CREATININE 0.71 03/17/2020   BUN 24 (H) 03/17/2020   NA 140 03/17/2020   K 4.1 03/17/2020   CL 103 03/17/2020   CO2 30 03/17/2020    The 10-year ASCVD risk  score Mikey Bussing DC Jr., et al., 2013) is: 13.1%   Values used to calculate the score:     Age: 79 years     Sex: Female     Is Non-Hispanic African American: No     Diabetic: No     Tobacco smoker: No     Systolic Blood Pressure: 638 mmHg     Is BP treated: Yes     HDL Cholesterol: 42 mg/dL     Total Cholesterol: 234 mg/dL  I reviewed the patients updated PMH, FH, and SocHx.    Patient Active Problem List   Diagnosis Date Noted  . Essential hypertension 04/14/2020  . Mixed hyperlipidemia 04/14/2020  . Chronic allergic rhinitis 09/27/2018  . 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 Dry eyes 08/28/2018  . Fear of side effects of medication 08/28/2018  . Vitamin D deficiency 11/06/2015  .  Osteopenia 11/05/2015  . Bell's palsy 09/26/2015  . 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  Social History: Patient  reports that she has quit smoking. She has never used smokeless tobacco. She reports current alcohol use. She reports that she does not use drugs.  Current Meds  Medication Sig  . hydrochlorothiazide (MICROZIDE) 12.5 MG capsule Take 1 capsule (12.5 mg total) by mouth daily.  . Multiple Vitamin (MULTIVITAMIN ADULT PO) Take by mouth. Women's 50+ multivitamin from [facility]    Review of Systems: Cardiovascular: negative for chest pain, palpitations, leg swelling, orthopnea Respiratory: negative for SOB, wheezing or persistent cough Gastrointestinal: negative for abdominal pain Genitourinary: negative for dysuria or gross hematuria  Objective  Vitals: BP 124/72 Comment: by consistent home readings  Pulse 91   Temp 98 F (36.7 C) (Temporal)   Resp 18   Ht 5\' 5"  (1.651 m)   Wt 142 lb 3.2 oz (64.5 kg)   SpO2 97%   BMI 23.66 kg/m  General: no acute distress  Psych:  Alert and oriented, anxious mood and affect Cardiovascular:  RRR without murmur. no edema   Commons side effects, risks, benefits, and alternatives for medications and treatment plan prescribed today were discussed, and the patient expressed understanding of the given instructions. Patient is instructed to call or message via MyChart if he/she has any questions or concerns regarding our treatment plan. No barriers to understanding were identified. We discussed Red Flag symptoms and signs in detail. Patient expressed understanding regarding what to do in case of urgent or emergency type symptoms.   Medication list was reconciled, printed and provided to the patient in AVS. Patient instructions and summary information was reviewed with the patient as documented in the AVS. This note was prepared with assistance of Dragon voice recognition software. Occasional  wrong-word or sound-a-like substitutions may have occurred due to the inherent limitations of voice recognition software  This visit occurred during the SARS-CoV-2 public health emergency.  Safety protocols were in place, including screening questions prior to the visit, additional usage of staff PPE, and extensive cleaning of exam room while observing appropriate contact time as indicated for disinfecting solutions.

## 2020-07-18 NOTE — Patient Instructions (Signed)
Please return in 6 months for hypertension follow up. Please keep taking your medication daily. It is helping!  You may check your blood pressures about once or twice a week for me please.  If you have any questions or concerns, please don't hesitate to send me a message via MyChart or call the office at [phone]. Thank you for visiting with [place] today! It's our pleasure caring for you.

## 2020-07-18 NOTE — Addendum Note (Signed)
Addended by: Thomes Cake on: 07/18/2020 11:11 AM   Modules accepted: Orders

## 2020-07-28 DIAGNOSIS — D235 Other benign neoplasm of skin of trunk: Secondary | ICD-10-CM | POA: Diagnosis not present

## 2020-07-28 DIAGNOSIS — D485 Neoplasm of uncertain behavior of skin: Secondary | ICD-10-CM | POA: Diagnosis not present

## 2020-07-28 DIAGNOSIS — D1801 Hemangioma of skin and subcutaneous tissue: Secondary | ICD-10-CM | POA: Diagnosis not present

## 2020-07-28 DIAGNOSIS — Z85828 Personal history of other malignant neoplasm of skin: Secondary | ICD-10-CM | POA: Diagnosis not present

## 2020-07-28 DIAGNOSIS — I781 Nevus, non-neoplastic: Secondary | ICD-10-CM | POA: Diagnosis not present

## 2020-07-28 DIAGNOSIS — L821 Other seborrheic keratosis: Secondary | ICD-10-CM | POA: Diagnosis not present

## 2020-07-28 DIAGNOSIS — D3617 Benign neoplasm of peripheral nerves and autonomic nervous system of trunk, unspecified: Secondary | ICD-10-CM | POA: Diagnosis not present

## 2020-08-26 ENCOUNTER — Other Ambulatory Visit: Payer: Self-pay

## 2020-08-26 ENCOUNTER — Ambulatory Visit
Admission: RE | Admit: 2020-08-26 | Discharge: 2020-08-26 | Disposition: A | Discharge status: Home / Self Care | Payer: Medicare Other | Source: Ambulatory Visit | Attending: Family Medicine | Admitting: Family Medicine

## 2020-08-26 DIAGNOSIS — M85852 Other specified disorders of bone density and structure, left thigh: Secondary | ICD-10-CM

## 2020-08-26 DIAGNOSIS — Z13820 Encounter for screening for osteoporosis: Secondary | ICD-10-CM | POA: Diagnosis not present

## 2020-08-26 DIAGNOSIS — Z780 Asymptomatic menopausal state: Secondary | ICD-10-CM | POA: Diagnosis not present

## 2020-08-26 DIAGNOSIS — E2839 Other primary ovarian failure: Secondary | ICD-10-CM

## 2021-01-19 ENCOUNTER — Ambulatory Visit (INDEPENDENT_AMBULATORY_CARE_PROVIDER_SITE_OTHER): Payer: Medicare Other | Admitting: Family Medicine

## 2021-01-19 ENCOUNTER — Encounter: Payer: Self-pay | Admitting: Family Medicine

## 2021-01-19 ENCOUNTER — Other Ambulatory Visit: Payer: Self-pay

## 2021-01-19 VITALS — BP 122/72 | HR 80 | Temp 97.9°F | Ht 65.0 in | Wt 144.4 lb

## 2021-01-19 DIAGNOSIS — I10 Essential (primary) hypertension: Secondary | ICD-10-CM | POA: Diagnosis not present

## 2021-01-19 DIAGNOSIS — J309 Allergic rhinitis, unspecified: Secondary | ICD-10-CM

## 2021-01-19 DIAGNOSIS — F40298 Other specified phobia: Secondary | ICD-10-CM | POA: Diagnosis not present

## 2021-01-19 MED ORDER — FLUTICASONE PROPIONATE 50 MCG/ACT NA SUSP: 1.0000 | Freq: Every day | NASAL | 6 refills | Status: DC

## 2021-01-19 NOTE — Progress Notes (Signed)
Subjective  CC:  Chief Complaint  Patient presents with  . Hypertension    Brought BP list from home   . Sinus Problem    Several episodes of bloody nasal drainage. Think this is due to heat running during the winter time    HPI: Denise Lin is a 70 y.o. female who presents to the office today to address the problems listed above in the chief complaint.  Hypertension f/u: Control is good .  Patient has whitecoat syndrome.  Very nervous at the doctor's office.  Home readings which she takes twice a week are excellent.  Averaging 1 10-1 20 over 70s.  Pt reports she is doing well. taking medications as instructed, no medication side effects noted, no TIAs, no chest pain on exertion, no dyspnea on exertion, no swelling of ankles. She denies adverse effects from his BP medications. Compliance with medication is good.   Has chronic allergies and chronic nasal drainage, mostly clear however over the winter months she has had some blood-tinged mucus.  No sinus pain, fevers or dental pain.  She does not feel sick.  She does not typically use allergy medicines.  Her allergies do get worse over the spring.  Assessment  1. White coat syndrome with diagnosis of hypertension   2. Chronic allergic rhinitis   3. Fear of side effects of medication      Plan    Hypertension f/u: BP control is well controlled.  Continue low-dose HCTZ 12.5 mg daily.  Continue home monitoring.  Excellent control  Chronic allergies : Currently active.  Education given.  Start Flonase.  No sign or symptom of infection at this time.  Praised for using medications and doing so well. Education regarding management of these chronic disease states was given. Management strategies discussed on successive visits include dietary and exercise recommendations, goals of achieving and maintaining IBW, and lifestyle modifications aiming for adequate sleep and minimizing stressors.   Follow up: 3 months for complete physical and  follow-up hypertension  No orders of the defined types were placed in this encounter.  Meds ordered this encounter  Medications  . fluticasone (FLONASE) 50 MCG/ACT nasal spray    Sig: Place 1 spray into both nostrils daily.    Dispense:  16 g    Refill:  6      BP Readings from Last 3 Encounters:  01/19/21 122/72  07/18/20 124/72  04/14/20 (!) 150/78   Wt Readings from Last 3 Encounters:  01/19/21 144 lb 6.4 oz (65.5 kg)  07/18/20 142 lb 3.2 oz (64.5 kg)  04/14/20 145 lb (65.8 kg)    Lab Results  Component Value Date   CHOL 234 (H) 03/17/2020   CHOL 231 (H) 09/27/2018   CHOL 219 (H) 09/26/2015   Lab Results  Component Value Date   HDL 42.00 03/17/2020   HDL 47.30 09/27/2018   HDL 49 09/26/2015   Lab Results  Component Value Date   LDLCALC 142 (H) 09/26/2015   Lab Results  Component Value Date   TRIG 248.0 (H) 03/17/2020   TRIG 232.0 (H) 09/27/2018   TRIG 138 09/26/2015   Lab Results  Component Value Date   CHOLHDL 6 03/17/2020   CHOLHDL 5 09/27/2018   CHOLHDL 4.5 (H) 09/26/2015   Lab Results  Component Value Date   LDLDIRECT 113.0 03/17/2020   LDLDIRECT 130.0 09/27/2018   Lab Results  Component Value Date   CREATININE 0.71 03/17/2020   BUN 24 (H) 03/17/2020  NA 140 03/17/2020   K 4.1 03/17/2020   CL 103 03/17/2020   CO2 30 03/17/2020    The 10-year ASCVD risk score Mikey Bussing DC Jr., et al., 2013) is: 13.8%   Values used to calculate the score:     Age: 1 years     Sex: Female     Is Non-Hispanic African American: No     Diabetic: No     Tobacco smoker: No     Systolic Blood Pressure: 824 mmHg     Is BP treated: Yes     HDL Cholesterol: 42 mg/dL     Total Cholesterol: 234 mg/dL  I reviewed the patients updated PMH, FH, and SocHx.    Patient Active Problem List   Diagnosis Date Noted  . White coat syndrome with diagnosis of hypertension 04/14/2020  . Mixed hyperlipidemia 04/14/2020  . Chronic allergic rhinitis 09/27/2018  .  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 Dry eyes 08/28/2018  . Fear of side effects of medication 08/28/2018  . Vitamin D deficiency 11/06/2015  . Osteopenia 11/05/2015  . 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  Social History: Patient  reports that she has quit smoking. She has never used smokeless tobacco. She reports current alcohol use. She reports that she does not use drugs.  Current Meds  Medication Sig  . hydrochlorothiazide (MICROZIDE) 12.5 MG capsule Take 1 capsule (12.5 mg total) by mouth daily.  . Multiple Vitamin (MULTIVITAMIN ADULT PO) Take by mouth. Women's 50+ multivitamin from [facility]    Review of Systems: Cardiovascular: negative for chest pain, palpitations, leg swelling, orthopnea Respiratory: negative for SOB, wheezing or persistent cough Gastrointestinal: negative for abdominal pain Genitourinary: negative for dysuria or gross hematuria  Objective  Vitals: BP 122/72 Comment: BY CONSISTENT HOME READINGS SINCE JANUARY  Pulse 80   Temp 97.9 F (36.6 C) (Temporal)   Ht 5\' 5"  (1.651 m)   Wt 144 lb 6.4 oz (65.5 kg)   SpO2 98%   BMI 24.03 kg/m  General: no acute distress  Psych:  Alert and oriented, normal mood and affect HEENT:  Normocephalic, atraumatic, supple neck  Cardiovascular:  RRR without murmur. no edema Respiratory:  Good breath sounds bilaterally, CTAB with normal respiratory effort   Commons side effects, risks, benefits, and alternatives for medications and treatment plan prescribed today were discussed, and the patient expressed understanding of the given instructions. Patient is instructed to call or message via MyChart if he/she has any questions or concerns regarding our treatment plan. No barriers to understanding were identified. We discussed Red Flag symptoms and signs in detail. Patient expressed understanding regarding what to do in case of urgent or emergency type symptoms.   Medication  list was reconciled, printed and provided to the patient in AVS. Patient instructions and summary information was reviewed with the patient as documented in the AVS. This note was prepared with assistance of Dragon voice recognition software. Occasional wrong-word or sound-a-like substitutions may have occurred due to the inherent limitations of voice recognition software  This visit occurred during the SARS-CoV-2 public health emergency.  Safety protocols were in place, including screening questions prior to the visit, additional usage of staff PPE, and extensive cleaning of exam room while observing appropriate contact time as indicated for disinfecting solutions.

## 2021-01-19 NOTE — Patient Instructions (Signed)
Please return in 3 months for your annual complete physical; please come fasting.  Your blood pressure looks perfect!  Try the nasal spray for your allergies. It should help get you through the pollen season.   If you have any questions or concerns, please don't hesitate to send me a message via MyChart or call the office at [phone]. Thank you for visiting with [place] today! It's our pleasure caring for you.

## 2021-01-28 DIAGNOSIS — L821 Other seborrheic keratosis: Secondary | ICD-10-CM | POA: Diagnosis not present

## 2021-01-28 DIAGNOSIS — I781 Nevus, non-neoplastic: Secondary | ICD-10-CM | POA: Diagnosis not present

## 2021-01-28 DIAGNOSIS — L814 Other melanin hyperpigmentation: Secondary | ICD-10-CM | POA: Diagnosis not present

## 2021-01-28 DIAGNOSIS — L820 Inflamed seborrheic keratosis: Secondary | ICD-10-CM | POA: Diagnosis not present

## 2021-01-28 DIAGNOSIS — C44319 Basal cell carcinoma of skin of other parts of face: Secondary | ICD-10-CM | POA: Diagnosis not present

## 2021-01-28 DIAGNOSIS — X32XXXS Exposure to sunlight, sequela: Secondary | ICD-10-CM | POA: Diagnosis not present

## 2021-01-28 DIAGNOSIS — D485 Neoplasm of uncertain behavior of skin: Secondary | ICD-10-CM | POA: Diagnosis not present

## 2021-01-28 DIAGNOSIS — D1801 Hemangioma of skin and subcutaneous tissue: Secondary | ICD-10-CM | POA: Diagnosis not present

## 2021-01-28 DIAGNOSIS — Z85828 Personal history of other malignant neoplasm of skin: Secondary | ICD-10-CM | POA: Diagnosis not present

## 2021-04-22 ENCOUNTER — Other Ambulatory Visit: Payer: Self-pay | Admitting: Family Medicine

## 2021-04-30 ENCOUNTER — Ambulatory Visit (INDEPENDENT_AMBULATORY_CARE_PROVIDER_SITE_OTHER): Payer: Medicare Other | Admitting: Family Medicine

## 2021-04-30 ENCOUNTER — Encounter: Payer: Self-pay | Admitting: Family Medicine

## 2021-04-30 ENCOUNTER — Other Ambulatory Visit: Payer: Self-pay

## 2021-04-30 VITALS — BP 117/72 | HR 74 | Temp 98.3°F | Ht 65.0 in | Wt 140.2 lb

## 2021-04-30 DIAGNOSIS — E559 Vitamin D deficiency, unspecified: Secondary | ICD-10-CM

## 2021-04-30 DIAGNOSIS — M85852 Other specified disorders of bone density and structure, left thigh: Secondary | ICD-10-CM | POA: Diagnosis not present

## 2021-04-30 DIAGNOSIS — Z0000 Encounter for general adult medical examination without abnormal findings: Secondary | ICD-10-CM | POA: Diagnosis not present

## 2021-04-30 DIAGNOSIS — K7689 Other specified diseases of liver: Secondary | ICD-10-CM | POA: Diagnosis not present

## 2021-04-30 DIAGNOSIS — J309 Allergic rhinitis, unspecified: Secondary | ICD-10-CM | POA: Diagnosis not present

## 2021-04-30 DIAGNOSIS — I10 Essential (primary) hypertension: Secondary | ICD-10-CM | POA: Diagnosis not present

## 2021-04-30 DIAGNOSIS — E782 Mixed hyperlipidemia: Secondary | ICD-10-CM

## 2021-04-30 DIAGNOSIS — H04123 Dry eye syndrome of bilateral lacrimal glands: Secondary | ICD-10-CM | POA: Diagnosis not present

## 2021-04-30 DIAGNOSIS — Z7185 Encounter for immunization safety counseling: Secondary | ICD-10-CM

## 2021-04-30 LAB — CBC WITH DIFFERENTIAL/PLATELET
Basophils Absolute: 0.1 10*3/uL (ref 0.0–0.1)
Basophils Relative: 1.2 % (ref 0.0–3.0)
Eosinophils Absolute: 0.1 10*3/uL (ref 0.0–0.7)
Eosinophils Relative: 1.7 % (ref 0.0–5.0)
HCT: 38.8 % (ref 36.0–46.0)
Hemoglobin: 13 g/dL (ref 12.0–15.0)
Lymphocytes Relative: 31.8 % (ref 12.0–46.0)
Lymphs Abs: 2 10*3/uL (ref 0.7–4.0)
MCHC: 33.4 g/dL (ref 30.0–36.0)
MCV: 87.7 fl (ref 78.0–100.0)
Monocytes Absolute: 0.3 10*3/uL (ref 0.1–1.0)
Monocytes Relative: 4.7 % (ref 3.0–12.0)
Neutro Abs: 3.8 10*3/uL (ref 1.4–7.7)
Neutrophils Relative %: 60.6 % (ref 43.0–77.0)
Platelets: 302 10*3/uL (ref 150.0–400.0)
RBC: 4.42 Mil/uL (ref 3.87–5.11)
RDW: 14.1 % (ref 11.5–15.5)
WBC: 6.3 10*3/uL (ref 4.0–10.5)

## 2021-04-30 LAB — LIPID PANEL
Cholesterol: 212 mg/dL — ABNORMAL HIGH (ref 0–200)
HDL: 50.6 mg/dL (ref >39.00)
NonHDL: 161.49
Total CHOL/HDL Ratio: 4
Triglycerides: 201 mg/dL — ABNORMAL HIGH (ref 0.0–149.0)
VLDL: 40.2 mg/dL — ABNORMAL HIGH (ref 0.0–40.0)

## 2021-04-30 LAB — COMPREHENSIVE METABOLIC PANEL
ALT: 22 U/L (ref 0–35)
AST: 18 U/L (ref 0–37)
Albumin: 4.5 g/dL (ref 3.5–5.2)
Alkaline Phosphatase: 72 U/L (ref 39–117)
BUN: 18 mg/dL (ref 6–23)
CO2: 29 mEq/L (ref 19–32)
Calcium: 9.1 mg/dL (ref 8.4–10.5)
Chloride: 105 mEq/L (ref 96–112)
Creatinine, Ser: 0.6 mg/dL (ref 0.40–1.20)
GFR: 90.25 mL/min (ref >60.00)
Glucose, Bld: 94 mg/dL (ref 70–99)
Potassium: 4.1 mEq/L (ref 3.5–5.1)
Sodium: 142 mEq/L (ref 135–145)
Total Bilirubin: 0.8 mg/dL (ref 0.2–1.2)
Total Protein: 7.2 g/dL (ref 6.0–8.3)

## 2021-04-30 LAB — TSH: TSH: 1.96 u[IU]/mL (ref 0.35–4.50)

## 2021-04-30 LAB — LDL CHOLESTEROL, DIRECT: Direct LDL: 125 mg/dL

## 2021-04-30 MED ORDER — SHINGRIX 50 MCG/0.5ML IM SUSR: 0.5000 mL | Freq: Once | INTRAMUSCULAR | 0 refills | Status: AC

## 2021-04-30 NOTE — Progress Notes (Signed)
Subjective  Chief Complaint  Patient presents with   Annual Exam    Fasting   Hypertension   Hyperlipidemia    HPI: Denise Lin is a 70 y.o. female who presents to [facility] today for a Female Wellness Visit. She also has the concerns and/or needs as listed above in the chief complaint. These will be addressed in addition to the Health Maintenance Visit.   Wellness Visit: annual visit with health maintenance review and exam without Pap  Health maintenance: Screens up-to-date.  Mammogram due in July.  Patient to schedule.  Eligible for Shingrix.  Healthy lifestyle.  Recently went to a bowling tournament in [place].  Chronic disease f/u and/or acute problem visit: (deemed necessary to be done in addition to the wellness visit): hypertension with history of whitecoat syndrome: Normal blood pressure in the office today.  Continues to check at home and runs normal.  On low-dose 12.5 mg HCTZ daily.  Well-tolerated. Hyperlipidemia: Has declined statin in the past.  Education counseling done again.  Fears side effects.  Risk factors for cardiovascular disease include hypertension and age. Reviewed most recent bone density showing osteopenia: DEXA scans are up-to-date.  Continues on calcium and vitamin D.  Walks for exercise. Complains of right nostril congestion or dryness.  Has difficulty breathing.  Daily will have mucoid discharge in the a.m.  Has history of deviated septum status post repair in her teen years.  Reports had brain MRI in the remote past showing sinus infection.  No significant sneezing or eye allergy symptoms.  Has used Flonase in the past.  Not using anything currently. Dry eyes: Uses drops as needed  Assessment  1. Annual physical exam   2. Mixed hyperlipidemia   3. White coat syndrome with diagnosis of hypertension   4. Vitamin D deficiency   5. Osteopenia of left hip   6. Chronic allergic rhinitis   7. Dry eyes   8. Vaccine counseling   9.  Focal nodular hyperplasia of liver      Plan  Female Wellness Visit: Age appropriate Health Maintenance and Prevention measures were discussed with patient. Included topics are cancer screening recommendations, ways to keep healthy (see AVS) including dietary and exercise recommendations, regular eye and dental care, use of seat belts, and avoidance of moderate alcohol use and tobacco use.  Patient to schedule mammogram other screens up-to-date BMI: discussed patient's BMI and encouraged positive lifestyle modifications to help get to or maintain a target BMI. HM needs and immunizations were addressed and ordered. See below for orders. See HM and immunization section for updates.  Education about shingles vaccination given.  Prescription given.  Patient will consider Routine labs and screening tests ordered including cmp, cbc and lipids where appropriate. Discussed recommendations regarding Vit D and calcium supplementation (see AVS)   Chronic disease management visit and/or acute problem visit: Hypertension: Now well controlled.  No whitecoat elevation today in the office.  When she is getting more comfortable here.  Continue home monitoring.  Continue HCTZ 12.5 mg daily.  Check renal function electrolytes Hyperlipidemia: Recheck lipids and cardiovascular risk.  Recommend starting statin if elevated.  Patient will consider.  Discussed risk versus benefits. Nasal obstruction: Possibly related to chronic allergies but could have chronic sinusitis, also deviated septum.  She will restart Flonase.  Declines antibiotic trial or imaging studies.  If not improving, recommend ENT evaluation.  Patient agrees Osteopenia: Discussed prevention.  Bone density up-to-date. Focal nodular hyperplasia: Benign.  Following liver test.  Follow up: 12 months for blood pressure follow-up and complete physical. Orders Placed This Encounter  Procedures   CBC with Differential/Platelet   Comprehensive metabolic panel    Lipid panel   TSH   Meds ordered this encounter  Medications   Zoster Vaccine Adjuvanted [facility]) injection    Sig: Inject 0.5 mLs into the muscle once for 1 dose. Please give 2nd dose 2-6 months after first dose    Dispense:  2 each    Refill:  0      Body mass index is 23.33 kg/m. 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 Osteopenia.      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 Followed by [facility]. Benign and stable. (vs fibroadenoma). Never biopsied     Health Maintenance  Topic Date Due   Zoster Vaccines- Shingrix (1 of 2) Never done   COVID-19 Vaccine (3 - Booster for Moderna series) 08/11/2020   MAMMOGRAM  05/30/2021   INFLUENZA VACCINE  06/08/2021   COLONOSCOPY (Pts 45-36yrs Insurance coverage will need to be confirmed)  11/08/2024   DEXA SCAN  08/26/2025   Hepatitis C Screening  Completed   PNA vac Low Risk Adult  Completed   HPV VACCINES  Aged Out   TETANUS/TDAP  Discontinued   Immunization History  Administered Date(s) Administered   Fluad Quad(high Dose 65+) 07/18/2020   Influenza, High Dose Seasonal PF 12/15/2016, 08/28/2018   Influenza,inj,Quad PF,6+ Mos 09/26/2015   Moderna Sars-Covid-2 Vaccination 02/12/2020, 03/11/2020   Pneumococcal Polysaccharide-23 09/27/2018   We updated and reviewed the patient's past history in  detail and it is documented below. Allergies: Patient has No Known Allergies. 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 Past Surgical History Patient  has a past surgical history that includes Uterine fibroid surgery; Nose surgery; Appendectomy; Dilation and curettage of uterus; Breast surgery; and Robotic assited partial nephrectomy (Left, 07/19/2014). Family History: Patient family history includes COPD in her maternal grandfather and mother; Colon cancer (age of onset: 43) in her paternal grandfather; Diabetes in her maternal grandfather; Drug abuse in her brother; Heart failure in her paternal grandmother; Hypertension in her father and son; Lung cancer in her mother; Obesity in her son; Pancreatic cancer in her maternal grandfather; Parkinson's disease in her father; Squamous cell carcinoma in her father; Suicidality in her brother. Social History:  Patient  reports that she has quit smoking. She has never used smokeless tobacco. She reports current alcohol use. She reports that she does not use drugs.  Review of Systems: Constitutional: negative for fever or malaise Ophthalmic: negative for photophobia, double vision or loss of vision  Cardiovascular: negative for chest pain, dyspnea on exertion, or new LE swelling Respiratory: negative for SOB or persistent cough Gastrointestinal: negative for abdominal pain, change in bowel habits or melena Genitourinary: negative for dysuria or gross hematuria, no abnormal uterine bleeding or disharge Musculoskeletal: negative for new gait disturbance or muscular weakness Integumentary: negative for new or persistent rashes, no breast lumps Neurological: negative for TIA or stroke symptoms Psychiatric: negative for SI or delusions Allergic/Immunologic: negative for  hives  Patient Care Team    Relationship Specialty Notifications Start End  Leamon Arnt, MD PCP - General Family Medicine  08/28/18     Objective  Vitals: BP 117/72   Pulse 74   Temp 98.3 F (36.8 C) (Temporal)   Ht 5\' 5"  (1.651 m)   Wt 140 lb 3.2 oz (63.6 kg)   SpO2 96%   BMI 23.33 kg/m  General:  Well developed, well nourished, no acute distress  Psych:  Alert and orientedx3,normal mood and affect HEENT:  Normocephalic, atraumatic, non-icteric sclera,  supple neck without adenopathy, mass or thyromegaly, deviated septum, nasal turbinate edema bilaterally.  Congestion present. Cardiovascular:  Normal S1, S2, RRR without gallop, rub or murmur Respiratory:  Good breath sounds bilaterally, CTAB with normal respiratory effort Gastrointestinal: normal bowel sounds, soft, non-tender, no noted masses. No HSM MSK: no deformities, contusions. Joints are without erythema or swelling.  Skin:  Warm, no rashes or suspicious lesions noted, multiple moles. Neurologic:    Mental status is normal. CN 2-11 are normal. Gross motor and sensory exams are normal. Normal gait. No tremor Breast Exam: No mass, skin retraction or nipple discharge is appreciated in either breast. No axillary adenopathy. Fibrocystic changes are not noted   Commons side effects, risks, benefits, and alternatives for medications and treatment plan prescribed today were discussed, and the patient expressed understanding of the given instructions. Patient is instructed to call or message via MyChart if he/she has any questions or concerns regarding our treatment plan. No barriers to understanding were identified. We discussed Red Flag symptoms and signs in detail. Patient expressed understanding regarding what to do in case of urgent or emergency type symptoms.  Medication list was reconciled, printed and provided to the patient in AVS. Patient instructions and summary information was reviewed with the patient as documented in  the AVS. This note was prepared with assistance of Dragon voice recognition software. Occasional wrong-word or sound-a-like substitutions may have occurred due to the inherent limitations of voice recognition software  This visit occurred during the SARS-CoV-2 public health emergency.  Safety protocols were in place, including screening questions prior to the visit, additional usage of staff PPE, and extensive cleaning of exam room while observing appropriate contact time as indicated for disinfecting solutions.

## 2021-04-30 NOTE — Patient Instructions (Signed)
Please return in 12 months for your annual complete physical; please come fasting.   I will release your lab results to you on your MyChart account with further instructions. Please reply with any questions.    If you have any questions or concerns, please don't hesitate to send me a message via MyChart or call the office at [phone]. Thank you for visiting with us today! It's our pleasure caring for you.    

## 2021-07-22 ENCOUNTER — Other Ambulatory Visit: Payer: Self-pay | Admitting: Family Medicine

## 2021-07-22 DIAGNOSIS — Z1231 Encounter for screening mammogram for malignant neoplasm of breast: Secondary | ICD-10-CM

## 2021-08-05 DIAGNOSIS — Z79899 Other long term (current) drug therapy: Secondary | ICD-10-CM | POA: Diagnosis not present

## 2021-08-05 DIAGNOSIS — D485 Neoplasm of uncertain behavior of skin: Secondary | ICD-10-CM | POA: Diagnosis not present

## 2021-08-05 DIAGNOSIS — L720 Epidermal cyst: Secondary | ICD-10-CM | POA: Diagnosis not present

## 2021-08-05 DIAGNOSIS — Z85828 Personal history of other malignant neoplasm of skin: Secondary | ICD-10-CM | POA: Diagnosis not present

## 2021-08-05 DIAGNOSIS — L814 Other melanin hyperpigmentation: Secondary | ICD-10-CM | POA: Diagnosis not present

## 2021-08-05 DIAGNOSIS — D235 Other benign neoplasm of skin of trunk: Secondary | ICD-10-CM | POA: Diagnosis not present

## 2021-08-05 DIAGNOSIS — X32XXXS Exposure to sunlight, sequela: Secondary | ICD-10-CM | POA: Diagnosis not present

## 2021-08-05 DIAGNOSIS — B351 Tinea unguium: Secondary | ICD-10-CM | POA: Diagnosis not present

## 2021-08-05 DIAGNOSIS — D0359 Melanoma in situ of other part of trunk: Secondary | ICD-10-CM | POA: Diagnosis not present

## 2021-08-26 DIAGNOSIS — D0359 Melanoma in situ of other part of trunk: Secondary | ICD-10-CM | POA: Diagnosis not present

## 2021-08-28 ENCOUNTER — Other Ambulatory Visit: Payer: Self-pay

## 2021-08-28 ENCOUNTER — Ambulatory Visit
Admission: RE | Admit: 2021-08-28 | Discharge: 2021-08-28 | Disposition: A | Discharge status: Home / Self Care | Payer: Medicare Other | Source: Ambulatory Visit | Attending: Family Medicine | Admitting: Family Medicine

## 2021-08-28 DIAGNOSIS — Z1231 Encounter for screening mammogram for malignant neoplasm of breast: Secondary | ICD-10-CM

## 2021-09-09 DIAGNOSIS — Z85820 Personal history of malignant melanoma of skin: Secondary | ICD-10-CM | POA: Diagnosis not present

## 2021-10-19 DIAGNOSIS — L820 Inflamed seborrheic keratosis: Secondary | ICD-10-CM | POA: Diagnosis not present

## 2021-10-19 DIAGNOSIS — L720 Epidermal cyst: Secondary | ICD-10-CM | POA: Diagnosis not present

## 2021-11-03 DIAGNOSIS — L720 Epidermal cyst: Secondary | ICD-10-CM | POA: Diagnosis not present

## 2022-01-05 ENCOUNTER — Other Ambulatory Visit: Payer: Self-pay

## 2022-01-05 ENCOUNTER — Ambulatory Visit (INDEPENDENT_AMBULATORY_CARE_PROVIDER_SITE_OTHER): Payer: Medicare Other | Admitting: Family Medicine

## 2022-01-05 ENCOUNTER — Encounter: Payer: Self-pay | Admitting: Family Medicine

## 2022-01-05 VITALS — BP 130/70 | HR 76 | Temp 97.7°F | Ht 65.0 in | Wt 140.0 lb

## 2022-01-05 DIAGNOSIS — J3489 Other specified disorders of nose and nasal sinuses: Secondary | ICD-10-CM

## 2022-01-05 DIAGNOSIS — R059 Cough, unspecified: Secondary | ICD-10-CM

## 2022-01-05 LAB — POC COVID19 BINAXNOW: SARS Coronavirus 2 Ag: NEGATIVE

## 2022-01-05 MED ORDER — BENZONATATE 100 MG PO CAPS: 100.0000 mg | ORAL_CAPSULE | Freq: Three times a day (TID) | ORAL | 0 refills | Status: DC | PRN

## 2022-01-05 NOTE — Progress Notes (Signed)
Subjective:     Patient ID: Denise Lin, female    DOB: 1951-10-15, 71 y.o.   MRN: [id number]  Chief Complaint  Patient presents with   Cough    Was a dry cough, now productive, started a few weeks ago after babysitting granddaughter Coughing so much causing chest tightness and unable to rest at night Took Robitussin, but caused a headache   Nasal Congestion    HPI Chief complaint: cough, congestion Symptom onset: 3 wks Pertinent positives: dry cough since babysitting granddaughter.  A lot of coughing-chest gets tight and can't sleep.  Nasal congestion.  Minimal cough today.  At times, feels like congestion stuck in R, but better.  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   Removed 2015, Dr Fay Records, urology   Bell's palsy    recent    Endometriosis    GERD (gastroesophageal reflux disease)    Hematuria    recent    Osteopenia    Seizures ([place])    as an infant no problems since    Uterine polyp     Past Surgical History:  Procedure Laterality Date   APPENDECTOMY     BREAST CYST EXCISION Left    years ago 1990's   BREAST SURGERY Left    benign tumor removed from breast years ago   [facility]     x 2 for endomtriosis and polyp   NOSE SURGERY     ROBOTIC ASSITED PARTIAL NEPHRECTOMY Left 07/19/2014   Procedure: ROBOTIC ASSITED PARTIAL NEPHRECTOMY, LAPAROSCOPIC ADHESIOLYSIS;  Surgeon: Alexis Frock, MD;  Location: [facility];  Service: Urology;  Laterality: Left;   UTERINE FIBROID SURGERY      Outpatient Medications Prior to Visit   Medication Sig Dispense Refill   hydrochlorothiazide (MICROZIDE) 12.5 MG capsule TAKE 1 CAPSULE(12.5 MG) BY MOUTH DAILY 90 capsule 3   Multiple Vitamin (MULTIVITAMIN ADULT PO) Take by mouth. Women's 50+ multivitamin from [facility]     fluticasone (FLONASE) 50 MCG/ACT nasal spray Place 1 spray into both nostrils daily. (Patient not taking: Reported on 04/30/2021) 16 g 6   No facility-administered medications prior to visit.    No Known Allergies ROS neg/noncontributory except as noted HPI/below      Objective:     BP 130/70    Pulse 76    Temp 97.7 F (36.5 C) (Temporal)    Ht 5\' 5"  (1.651 m)    Wt 140 lb (63.5 kg)    SpO2 95%    BMI 23.30 kg/m  Wt Readings from Last 3 Encounters:  01/05/22 140 lb (63.5 kg)  04/30/21 140 lb 3.2 oz (63.6 kg)  01/19/21 144 lb 6.4 oz (65.5 kg)        Gen: WDWN NAD WF HEENT: NCAT, conjunctiva not injected, sclera nonicteric TM WNL B, OP moist, no exudates  NECK:  supple, no thyromegaly,sl enlarged submand nodes, no carotid bruits CARDIAC: RRR, S1S2+, no murmur. DP 2+B LUNGS: CTAB. No wheezes EXT:  no edema MSK: no gross abnormalities.  NEURO: A&O x3.  CN II-XII intact.  PSYCH: normal mood. Good eye contact  Results for orders placed or performed in visit on 01/05/22  POC COVID-19  Result Value Ref Range   SARS Coronavirus 2 Ag Negative Negative     Assessment & Plan:   Problem List Items Addressed This Visit   None Visit Diagnoses     Cough, unspecified type    -  Primary   Relevant Orders   POC COVID-19 (Completed)   Nasal drainage       Relevant Orders   POC COVID-19 (Completed)      Cough-prob viral. improving-tessalon perles.  Pt wants to wait on abx but will call if needs-zpk.   No orders of the defined types were placed in this encounter.   Wellington Hampshire, MD

## 2022-01-05 NOTE — Patient Instructions (Signed)
Meds have been sent the the pharmacy You can take tylenol for pain/fevers If worsening symptoms, let us know or go to the Emergency room   If worseing or need antibiotics-let me know

## 2022-01-14 ENCOUNTER — Telehealth: Payer: Self-pay | Admitting: Family Medicine

## 2022-01-14 NOTE — Telephone Encounter (Signed)
Copied from [place] [phone]. Topic: Medicare AWV ?>> Jan 14, 2022 10:44 AM Harris-Coley, Hannah Beat wrote: ?Reason for CRM: Left message for patient to schedule Annual Wellness Visit.  Please schedule with Nurse Health Advisor Charlott Rakes, RN at [facility].  Please call [phone] ask for Juliann Pulse ?

## 2022-01-22 ENCOUNTER — Ambulatory Visit (INDEPENDENT_AMBULATORY_CARE_PROVIDER_SITE_OTHER): Payer: Medicare Other

## 2022-01-22 VITALS — BP 130/72 | HR 93 | Temp 98.0°F | Wt 141.2 lb

## 2022-01-22 DIAGNOSIS — Z0000 Encounter for general adult medical examination without abnormal findings: Secondary | ICD-10-CM | POA: Diagnosis not present

## 2022-01-22 NOTE — Progress Notes (Addendum)
? ?Subjective:  ? Denise Lin is a 71 y.o. female who presents for Medicare Annual (Subsequent) preventive examination. ? ?Review of Systems    ? ?Cardiac Risk Factors include: advanced age (>67mn, >>46women);hypertension;dyslipidemia ? ?   ?Objective:  ?  ?Today's Vitals  ? 01/22/22 0811  ?BP: 130/72  ?Pulse: 93  ?Temp: 98 ?F (36.7 ?C)  ?SpO2: 94%  ?Weight: 141 lb 3.2 oz (64 kg)  ? ?Body mass index is 23.5 kg/m?. ? ?Advanced Directives 01/22/2022 03/17/2020 07/19/2014 07/19/2014 07/11/2014  ?Does Patient Have a Medical Advance Directive? Yes Yes Yes - Yes  ?Type of Advance Directive - Living will;[place]care [place] of [place]  ?Does patient want to make changes to medical advance directive? Yes (MAU/Ambulatory/Procedural Areas - Information given) No - Patient declined No - Patient declined No - Patient declined No - Patient declined  ?Copy of [place]iver [place] Chart? - No - copy requested Yes - No - copy requested  ? ? ?Current Medications (verified) ?Outpatient Encounter Medications as of 01/22/2022  ?Medication Sig  ? hydrochlorothiazide (MICROZIDE) 12.5 MG capsule TAKE 1 CAPSULE(12.5 MG) BY MOUTH DAILY  ? Multiple Vitamin (MULTIVITAMIN ADULT PO) Take by mouth. Women's 50+ [facility] ? benzonatate (TESSALON PERLES) 100 MG capsule Take 1 capsule (100 mg total) by mouth 3 (three) times daily as needed. (Patient not taking: Reported on 01/22/2022)  ? [DISCONTINUED] fluticasone (FLONASE) 50 MCG/ACT nasal spray Place 1 spray into both nostrils daily. (Patient not taking: Reported on 04/30/2021)  ? ?No facility-administered encounter medications on file as of 01/22/2022.  ? ? ?Allergies (verified) ?Patient has no known allergies.  ? ?History: ?Past Medical History:  ?Diagnosis Date  ? Anemia   ? with endometriosis   ? 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 Removed 2015, Dr TFay Records urology  ? Bell's palsy   ? recent   ?  Endometriosis   ? GERD (gastroesophageal reflux disease)   ? Hematuria   ? recent   ? Hypertension   ? Osteopenia   [place]   ? [place] problems since   ? Uterine polyp   ? ?Past Surgical History:  ?Procedure Laterality Date  ? APPENDECTOMY    ? BREAST CYST EXCISION Left   ? years ago 1990's  ? BREAST SURGERY Left   ? benign tumor removed from breast years ago  ? DILATION AND CURETTAGE OF UTERUS    ? x 2 for endomtriosis and polyp  ? NOSE SURGERY    ? ROBOTIC ASSITED PARTIAL NEPHRECTOMY Left 07/19/2014  ? Procedure: ROBOTIC ASSITED PARTIAL NEPHRECTOMY, LAPAROSCOPIC ADHESIOLYSIS;  Surgeon: TAlexis Frock MD;  Location: [facility];  Service: Urology;  Laterality: Left;  ? UTERINE FIBROID SURGERY    ? ?Family History  ?Problem Relation Age of Onset  ? Lung cancer Mother   ? COPD Mother   ? Parkinson's disease Father   ? Hypertension Father   ? Squamous cell carcinoma Father   ?     skin  ? COPD Maternal Grandfather   ? Diabetes Maternal Grandfather   ? Pancreatic cancer Maternal Grandfather   ? Heart failure Paternal Grandmother   ? Colon cancer Paternal Grandfather [phone] ? Suicidality Brother   ? Drug abuse Brother   ? Obesity Son   ? Hypertension Son   ? Breast cancer Neg Hx   ? ?Social History  ? ?Socioeconomic History  ?  Marital status: Married  ?  Spouse name: Not on file  ? Number of children: 2  ? Years of education: Not on file  ? Highest education level: Not on file  ?Occupational History  ? Occupation: Retired Scientist, research (medical), Teacher, adult education, Passenger transport manager and others  ?  Comment: retired in 2011  ?Tobacco Use  ? Smoking status: Former  ? Smokeless tobacco: Never  ?Vaping Use  ? Vaping Use: Never used  ?Substance and Sexual Activity  ? Alcohol use: Yes  ?  Comment: 1-2 glasses of wine daily   ? Drug use: No  ? Sexual activity: Yes  ?  Birth control/protection: Post-menopausal  ?Other Topics Concern  ? Not on file  ?Social History Narrative  ? 3 grands  ? ?Social Determinants of Health  ? ?Financial Resource Strain: Low Risk    ? Difficulty of Paying Living Expenses: Not hard at all  ?Food Insecurity: No Food Insecurity  ? Worried About Charity fundraiser in the Last Year: Never true  ? Ran Out of Food in the Last Year: Never true  ?Transportation Needs: No Transportation Needs  ? Lack of Transportation (Medical): No  ? Lack of Transportation (Non-Medical): No  ?Physical Activity: Insufficiently Active  ? Days of Exercise per Week: 2 days  ? Minutes of Exercise per Session: 50 min  ?Stress: No Stress Concern Present  ? Feeling of Stress : Not at all  ?Social Connections: Socially Integrated  ? Frequency of Communication with Friends and Family: More than three times a week  ? Frequency of Social Gatherings with Friends and Family: More than three times a week  ? Attends Religious Services: 1 to 4 times per year  ? Active Member of Clubs or Organizations: Yes  ? Attends Archivist Meetings: 1 to 4 times per year  ? Marital Status: Married  ? ? ?Tobacco Counseling ?Counseling given: Not Answered ? ? ?Clinical Intake: ? ?Pre-visit preparation completed: Yes ? ?Pain : No/denies pain ? ?  ? ?BMI - recorded: 23.5 ?Nutritional Status: BMI of 19-24  Normal ?Nutritional Risks: None ?Diabetes: No ? ?How often do you need to have someone help you when you read instructions, pamphlets, or other written materials from your doctor or pharmacy?: 1 - Never ? ?Diabetic?no ? ?Interpreter Needed?: No ? ?Information entered by :: Charlott Rakes, LPN ? ? ?Activities of Daily Living ?In your present state of health, do you have any difficulty performing the following activities: 01/22/2022  ?Hearing? N  ?Vision? N  ?Difficulty concentrating or making decisions? N  ?Walking or climbing stairs? N  ?Dressing or bathing? N  ?Doing errands, shopping? N  ?Preparing Food and eating ? N  ?Using the Toilet? N  ?In the past six months, have you accidently leaked urine? Y  ?Comment poise pads  ?Do you have problems with loss of bowel control? N  ?Managing your  Medications? N  ?Managing your Finances? N  ?Housekeeping or managing your Housekeeping? N  ?Some recent data might be hidden  ? ? ?Patient Care Team: ?Leamon Arnt, MD as PCP - General (Family Medicine) ? ?Indicate any recent Medical Services you may have received from other than [facility] providers in the past year (date may be approximate). ? ?   ?Assessment:  ? This is a routine wellness examination for [facility]. ? ?Hearing/Vision screen ?Hearing Screening - Comments:: Pt stated ear wax build up at times  ?Vision Screening - Comments:: Pt follows up with [facility] for annual  eye exams  ? ?Dietary issues and exercise activities discussed: ?Current Exercise Habits: Structured exercise class;Home exercise routine, Type of exercise: Other - see comments;walking;yoga (with hikes and yoga), Time (Minutes): 45, Frequency (Times/Week): 2, Weekly Exercise (Minutes/Week): 90 ? ? Goals Addressed   ? ?  ?  ?  ?  ? This Visit's Progress  ?  Patient Stated     ?  Continue to exercise to get to 3 times a week  ?  ? ?  ? ?Depression Screen ?PHQ 2/9 Scores 01/22/2022 01/19/2021 03/17/2020 03/17/2020 09/27/2018 08/12/2016 09/26/2015  ?PHQ - 2 Score 0 0 0 0 0 0 0  ?  ?Fall Risk ?Fall Risk  01/22/2022 04/30/2021 03/17/2020 03/17/2020 09/27/2018  ?Falls in the past year? 0 0 0 0 0  ?Number falls in past yr: 0 0 0 0 0  ?Injury with Fall? 0 0 0 0 -  ?Risk for fall due to : Impaired vision - - - -  ?Follow up Falls prevention discussed Falls evaluation completed Falls evaluation completed;Education provided;Falls prevention discussed - -  ? ? ?FALL RISK PREVENTION PERTAINING TO THE HOME: ? ?Any stairs in or around the home? No  ?If so, are there any without handrails? No  ?Home free of loose throw rugs in walkways, pet beds, electrical cords, etc? Yes  ?Adequate lighting in your home to reduce risk of falls? Yes  ? ?ASSISTIVE DEVICES UTILIZED TO PREVENT FALLS: ? ?Life alert? No  ?Use of a cane, walker or w/c? No  ?Grab bars in the bathroom? No   ?Shower chair or bench in shower? No  ?Elevated toilet seat or a handicapped toilet? No  ? ?TIMED UP AND GO: ? ?Was the test performed? Yes .  ?Length of time to ambulate 10 feet: 10 sec.  ? ?Gait steady and fast

## 2022-01-22 NOTE — Patient Instructions (Signed)
Denise Lin , ?Thank you for taking time to come for your Medicare Wellness Visit. I appreciate your ongoing commitment to your health goals. Please review the following plan we discussed and let me know if I can assist you in the future.  ? ?Screening recommendations/referrals: ?Colonoscopy: Done 11/08/14 repeat every 10 years  ?Mammogram: Done 08/28/21 repeat every year  ?Bone Density: Done 08/26/20 repeat every 2 years  ?Recommended yearly ophthalmology/optometry visit for glaucoma screening and checkup ?Recommended yearly dental visit for hygiene and checkup ? ?Vaccinations: ?Influenza vaccine: Due and discussed  ?Pneumococcal vaccine: Due and discussed  ?Tdap vaccine: Discontinued  ?Shingles vaccine: Shingrix discussed. Please contact your pharmacy for coverage information.    ?Covid-19:Completed 4/6, 03/11/20 ? ?Advanced directives: Advance directive discussed with you today. I have provided a copy for you to complete at home and have notarized. Once this is complete please bring a copy in to our office so we can scan it into your chart. ? ? ?Conditions/risks identified: Continue to exercise and increase to 3 days weekly  ? ?Next appointment: Follow up in one year for your annual wellness visit  ? ? ?Preventive Care 37 Years and Older, Female ?Preventive care refers to lifestyle choices and visits with your health care provider that can promote health and wellness. ?What does preventive care include? ?A yearly physical exam. This is also called an annual well check. ?Dental exams once or twice a year. ?Routine eye exams. Ask your health care provider how often you should have your eyes checked. ?Personal lifestyle choices, including: ?Daily care of your teeth and gums. ?Regular physical activity. ?Eating a healthy diet. ?Avoiding tobacco and drug use. ?Limiting alcohol use. ?Practicing safe sex. ?Taking low-dose aspirin every day. ?Taking vitamin and mineral supplements as recommended by your health care  provider. ?What happens during an annual well check? ?The services and screenings done by your health care provider during your annual well check will depend on your age, overall health, lifestyle risk factors, and family history of disease. ?Counseling  ?Your health care provider may ask you questions about your: ?Alcohol use. ?Tobacco use. ?Drug use. ?Emotional well-being. ?Home and relationship well-being. ?Sexual activity. ?Eating habits. ?History of falls. ?Memory and ability to understand (cognition). ?Work and work Statistician. ?Reproductive health. ?Screening  ?You may have the following tests or measurements: ?Height, weight, and BMI. ?Blood pressure. ?Lipid and cholesterol levels. These may be checked every 5 years, or more frequently if you are over 3 years old. ?Skin check. ?Lung cancer screening. You may have this screening every year starting at age 27 if you have a 30-pack-year history of smoking and currently smoke or have quit within the past 15 years. ?Fecal occult blood test (FOBT) of the stool. You may have this test every year starting at age 55. ?Flexible sigmoidoscopy or colonoscopy. You may have a sigmoidoscopy every 5 years or a colonoscopy every 10 years starting at age 20. ?Hepatitis C blood test. ?Hepatitis B blood test. ?Sexually transmitted disease (STD) testing. ?Diabetes screening. This is done by checking your blood sugar (glucose) after you have not eaten for a while (fasting). You may have this done every 1-3 years. ?Bone density scan. This is done to screen for osteoporosis. You may have this done starting at age 70. ?Mammogram. This may be done every 1-2 years. Talk to your health care provider about how often you should have regular mammograms. ?Talk with your health care provider about your test results, treatment options, and if necessary, the  need for more tests. ?Vaccines  ?Your health care provider may recommend certain vaccines, such as: ?Influenza vaccine. This is  recommended every year. ?Tetanus, diphtheria, and acellular pertussis (Tdap, Td) vaccine. You may need a Td booster every 10 years. ?Zoster vaccine. You may need this after age 48. ?Pneumococcal 13-valent conjugate (PCV13) vaccine. One dose is recommended after age 73. ?Pneumococcal polysaccharide (PPSV23) vaccine. One dose is recommended after age 26. ?Talk to your health care provider about which screenings and vaccines you need and how often you need them. ?This information is not intended to replace advice given to you by your health care provider. Make sure you discuss any questions you have with your health care provider. ?Document Released: 11/21/2015 Document Revised: 07/14/2016 Document Reviewed: 08/26/2015 ?Elsevier Interactive Patient Education ? 2017 [place]. ? ?Fall Prevention in the Home ?Falls can cause injuries. They can happen to people of all ages. There are many things you can do to make your home safe and to help prevent falls. ?What can I do on the outside of my home? ?Regularly fix the edges of walkways and driveways and fix any cracks. ?Remove anything that might make you trip as you walk through a door, such as a raised step or threshold. ?Trim any bushes or trees on the path to your home. ?Use bright outdoor lighting. ?Clear any walking paths of anything that might make someone trip, such as rocks or tools. ?Regularly check to see if handrails are loose or broken. Make sure that both sides of any steps have handrails. ?Any raised decks and porches should have guardrails on the edges. ?Have any leaves, snow, or ice cleared regularly. ?Use sand or salt on walking paths during winter. ?Clean up any spills in your garage right away. This includes oil or grease spills. ?What can I do in the bathroom? ?Use night lights. ?Install grab bars by the toilet and in the tub and shower. Do not use towel bars as grab bars. ?Use non-skid mats or decals in the tub or shower. ?If you need to sit down in  the shower, use a plastic, non-slip stool. ?Keep the floor dry. Clean up any water that spills on the floor as soon as it happens. ?Remove soap buildup in the tub or shower regularly. ?Attach bath mats securely with double-sided non-slip rug tape. ?Do not have throw rugs and other things on the floor that can make you trip. ?What can I do in the bedroom? ?Use night lights. ?Make sure that you have a light by your bed that is easy to reach. ?Do not use any sheets or blankets that are too big for your bed. They should not hang down onto the floor. ?Have a firm chair that has side arms. You can use this for support while you get dressed. ?Do not have throw rugs and other things on the floor that can make you trip. ?What can I do in the kitchen? ?Clean up any spills right away. ?Avoid walking on wet floors. ?Keep items that you use a lot in easy-to-reach places. ?If you need to reach something above you, use a strong step stool that has a grab bar. ?Keep electrical cords out of the way. ?Do not use floor polish or wax that makes floors slippery. If you must use wax, use non-skid floor wax. ?Do not have throw rugs and other things on the floor that can make you trip. ?What can I do with my stairs? ?Do not leave any items on the  stairs. ?Make sure that there are handrails on both sides of the stairs and use them. Fix handrails that are broken or loose. Make sure that handrails are as long as the stairways. ?Check any carpeting to make sure that it is firmly attached to the stairs. Fix any carpet that is loose or worn. ?Avoid having throw rugs at the top or bottom of the stairs. If you do have throw rugs, attach them to the floor with carpet tape. ?Make sure that you have a light switch at the top of the stairs and the bottom of the stairs. If you do not have them, ask someone to add them for you. ?What else can I do to help prevent falls? ?Wear shoes that: ?Do not have high heels. ?Have rubber bottoms. ?Are comfortable  and fit you well. ?Are closed at the toe. Do not wear sandals. ?If you use a stepladder: ?Make sure that it is fully opened. Do not climb a closed stepladder. ?Make sure that both sides of the stepladder are locked into

## 2022-02-03 DIAGNOSIS — Z85828 Personal history of other malignant neoplasm of skin: Secondary | ICD-10-CM | POA: Diagnosis not present

## 2022-02-03 DIAGNOSIS — L814 Other melanin hyperpigmentation: Secondary | ICD-10-CM | POA: Diagnosis not present

## 2022-02-03 DIAGNOSIS — D235 Other benign neoplasm of skin of trunk: Secondary | ICD-10-CM | POA: Diagnosis not present

## 2022-02-03 DIAGNOSIS — Z85820 Personal history of malignant melanoma of skin: Secondary | ICD-10-CM | POA: Diagnosis not present

## 2022-04-30 IMAGING — MG MM DIGITAL SCREENING BILAT W/ TOMO AND CAD
8 series · 8 of 24 positions shown · non-contrast
Comparison: Previous exam(s).

CLINICAL DATA: Screening.

EXAM:
DIGITAL SCREENING BILATERAL MAMMOGRAM WITH TOMOSYNTHESIS AND CAD
TECHNIQUE: Bilateral screening digital craniocaudal and mediolateral oblique
mammograms were obtained. Bilateral screening digital breast
tomosynthesis was performed. The images were evaluated with
computer-aided detection.

[R MLO synth-2D]
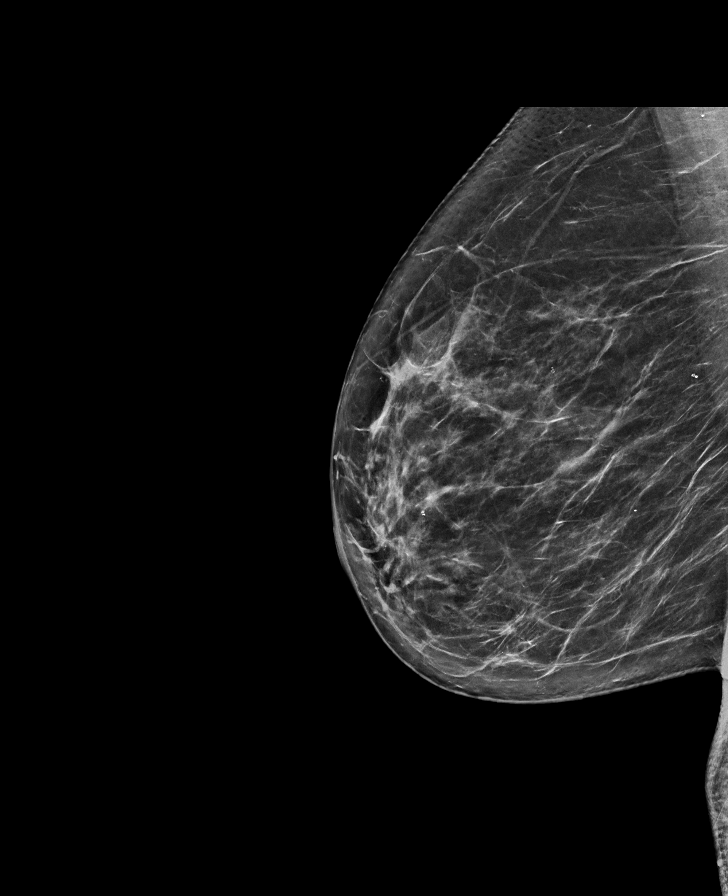

[R CC synth-2D]
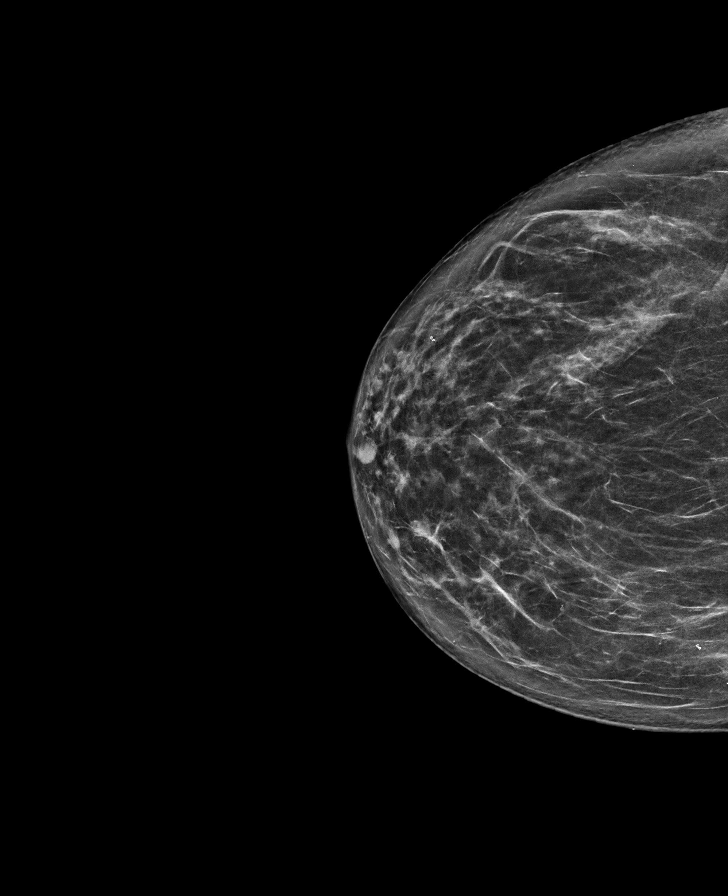

[L CC synth-2D]
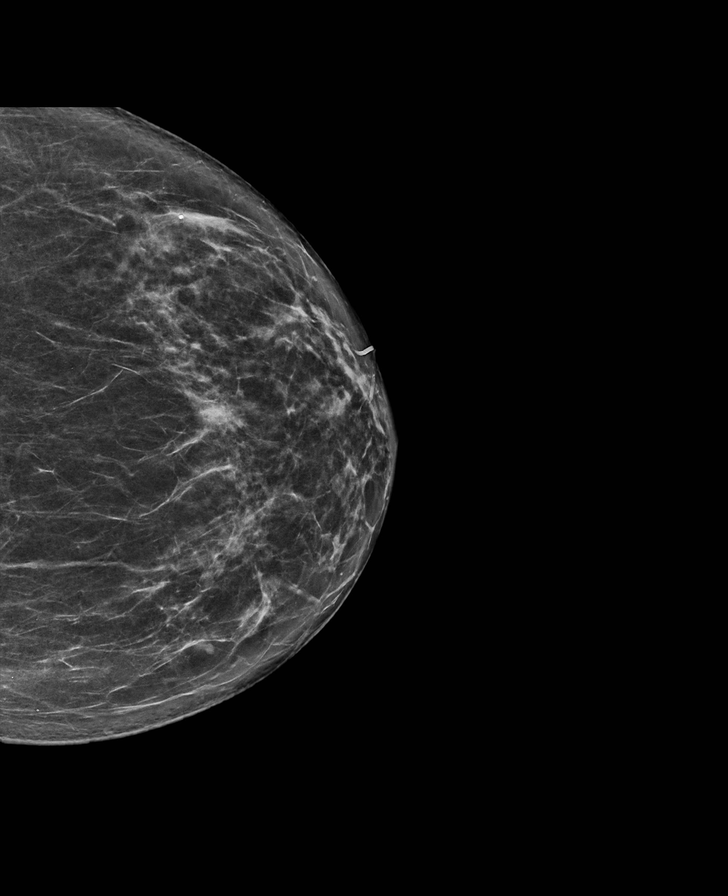

[L MLO synth-2D]
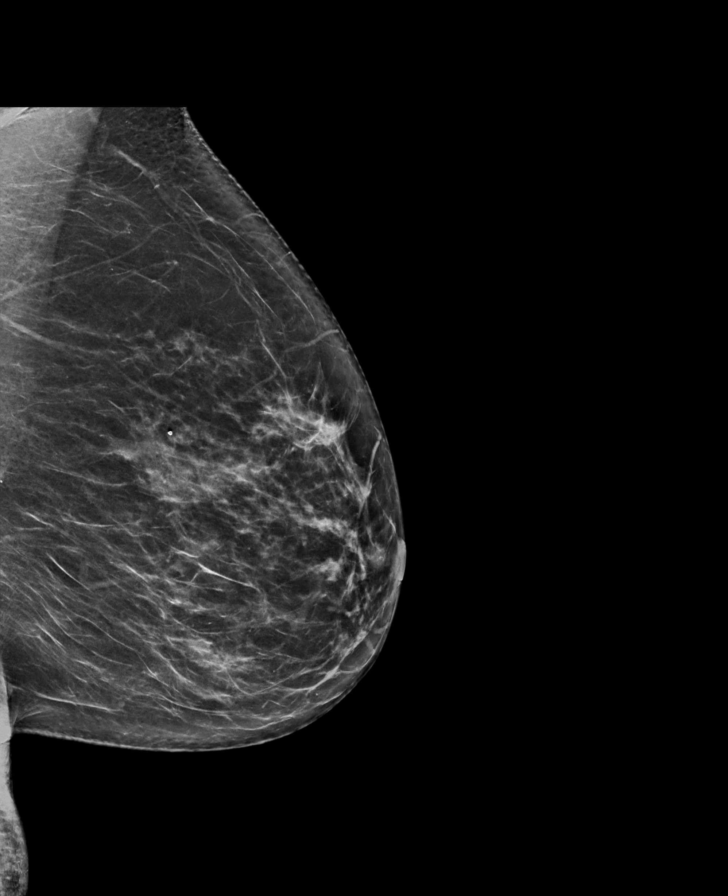

[L MLO tomo · tomo slice 39/77.0]
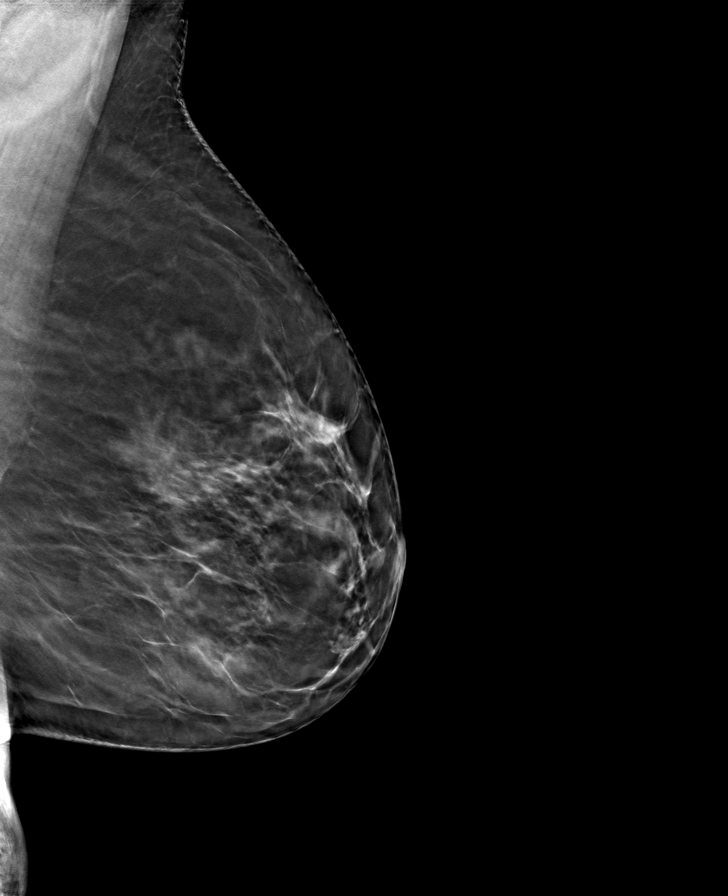

[R CC tomo · tomo slice 39/77.0]
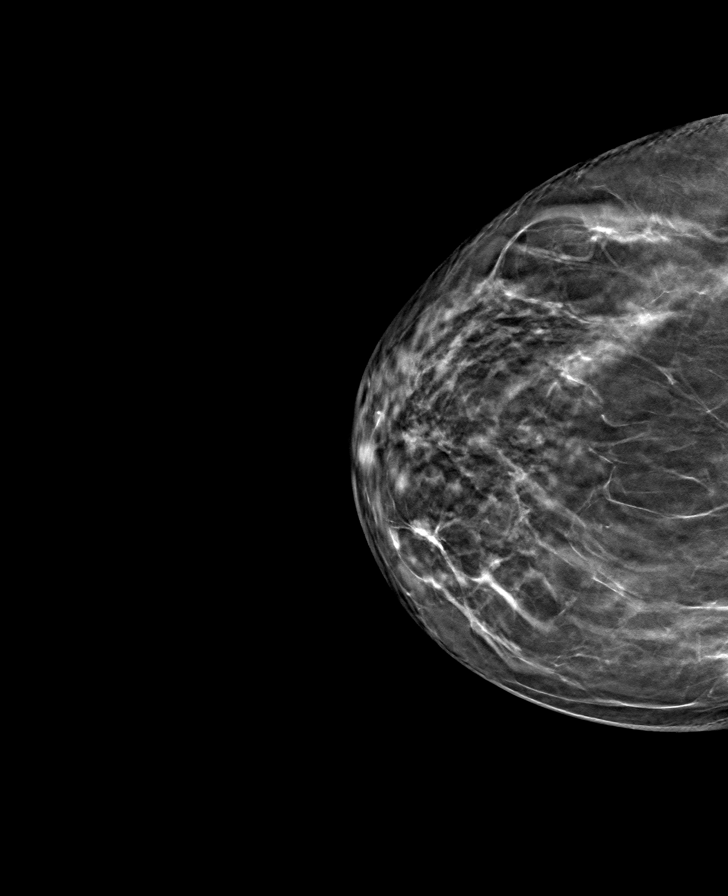

[L CC tomo · tomo slice 37/72.0]
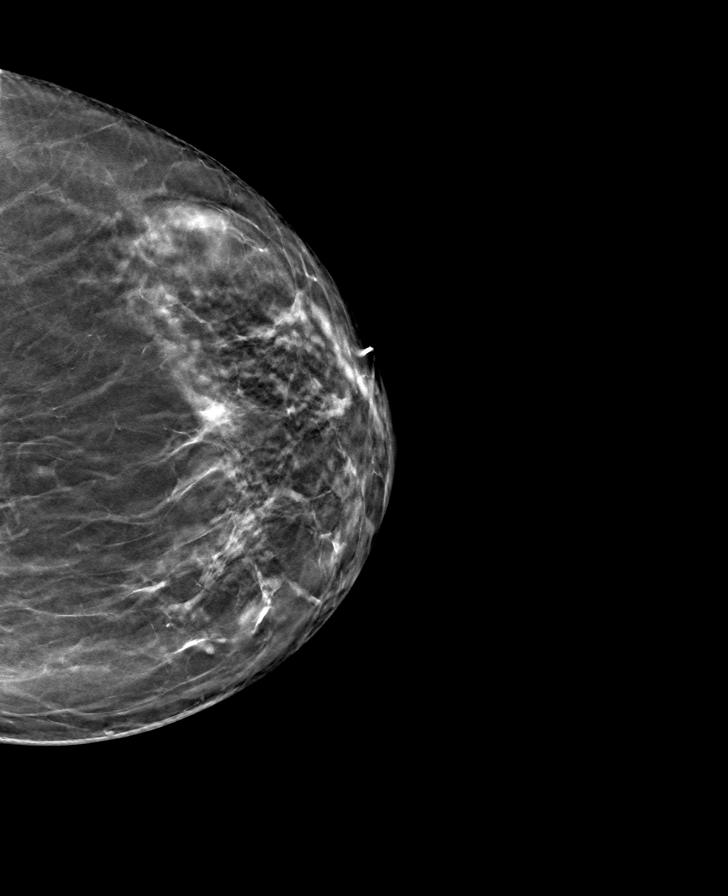

[R MLO tomo · tomo slice 41/80.0]
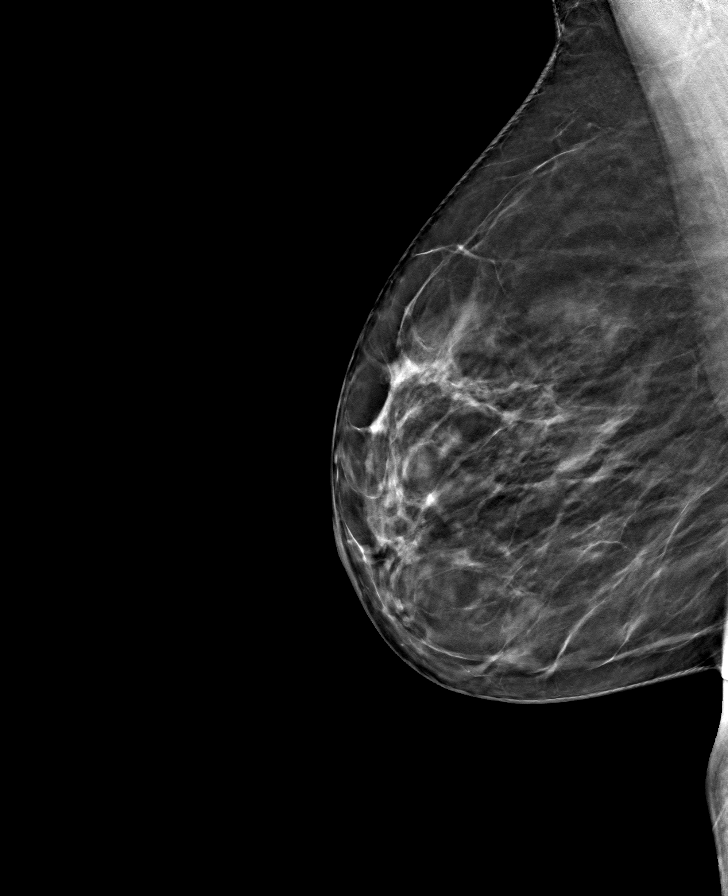

[8 of 24 positions shown; findings below may reference images not displayed]

ACR Breast Density Category b: There are scattered areas of
fibroglandular density.
FINDINGS: There are no findings suspicious for malignancy.
IMPRESSION: No mammographic evidence of malignancy. A result letter of this
screening mammogram will be mailed directly to the patient.

RECOMMENDATION:
Screening mammogram in one year. (Code:51-O-LD2)

BI-RADS CATEGORY  1: Negative.

## 2022-05-05 ENCOUNTER — Encounter: Payer: Self-pay | Admitting: Family Medicine

## 2022-05-05 ENCOUNTER — Ambulatory Visit (INDEPENDENT_AMBULATORY_CARE_PROVIDER_SITE_OTHER): Payer: Medicare Other | Admitting: Family Medicine

## 2022-05-05 VITALS — BP 132/78 | HR 76 | Temp 97.8°F | Ht 65.0 in | Wt 140.8 lb

## 2022-05-05 DIAGNOSIS — Z0000 Encounter for general adult medical examination without abnormal findings: Secondary | ICD-10-CM

## 2022-05-05 DIAGNOSIS — E782 Mixed hyperlipidemia: Secondary | ICD-10-CM | POA: Diagnosis not present

## 2022-05-05 DIAGNOSIS — Z5320 Procedure and treatment not carried out because of patient's decision for unspecified reasons: Secondary | ICD-10-CM

## 2022-05-05 DIAGNOSIS — I10 Essential (primary) hypertension: Secondary | ICD-10-CM

## 2022-05-05 DIAGNOSIS — M85852 Other specified disorders of bone density and structure, left thigh: Secondary | ICD-10-CM | POA: Diagnosis not present

## 2022-05-05 DIAGNOSIS — R195 Other fecal abnormalities: Secondary | ICD-10-CM | POA: Diagnosis not present

## 2022-05-05 DIAGNOSIS — Z23 Encounter for immunization: Secondary | ICD-10-CM

## 2022-05-05 LAB — CBC WITH DIFFERENTIAL/PLATELET
Basophils Absolute: 0.1 10*3/uL (ref 0.0–0.1)
Basophils Relative: 0.9 % (ref 0.0–3.0)
Eosinophils Absolute: 0.1 10*3/uL (ref 0.0–0.7)
Eosinophils Relative: 2.1 % (ref 0.0–5.0)
HCT: 40.8 % (ref 36.0–46.0)
Hemoglobin: 13.4 g/dL (ref 12.0–15.0)
Lymphocytes Relative: 37 % (ref 12.0–46.0)
Lymphs Abs: 2.4 10*3/uL (ref 0.7–4.0)
MCHC: 32.9 g/dL (ref 30.0–36.0)
MCV: 88.3 fl (ref 78.0–100.0)
Monocytes Absolute: 0.4 10*3/uL (ref 0.1–1.0)
Monocytes Relative: 6.4 % (ref 3.0–12.0)
Neutro Abs: 3.5 10*3/uL (ref 1.4–7.7)
Neutrophils Relative %: 53.6 % (ref 43.0–77.0)
Platelets: 303 10*3/uL (ref 150.0–400.0)
RBC: 4.63 Mil/uL (ref 3.87–5.11)
RDW: 13.9 % (ref 11.5–15.5)
WBC: 6.5 10*3/uL (ref 4.0–10.5)

## 2022-05-05 LAB — LIPID PANEL
Cholesterol: 196 mg/dL (ref 0–200)
HDL: 44.6 mg/dL (ref >39.00)
NonHDL: 151.52
Total CHOL/HDL Ratio: 4
Triglycerides: 239 mg/dL — ABNORMAL HIGH (ref 0.0–149.0)
VLDL: 47.8 mg/dL — ABNORMAL HIGH (ref 0.0–40.0)

## 2022-05-05 LAB — COMPREHENSIVE METABOLIC PANEL
ALT: 29 U/L (ref 0–35)
AST: 20 U/L (ref 0–37)
Albumin: 4.5 g/dL (ref 3.5–5.2)
Alkaline Phosphatase: 81 U/L (ref 39–117)
BUN: 20 mg/dL (ref 6–23)
CO2: 31 mEq/L (ref 19–32)
Calcium: 9.4 mg/dL (ref 8.4–10.5)
Chloride: 103 mEq/L (ref 96–112)
Creatinine, Ser: 0.67 mg/dL (ref 0.40–1.20)
GFR: 87.26 mL/min (ref >60.00)
Glucose, Bld: 101 mg/dL — ABNORMAL HIGH (ref 70–99)
Potassium: 4.5 mEq/L (ref 3.5–5.1)
Sodium: 142 mEq/L (ref 135–145)
Total Bilirubin: 0.4 mg/dL (ref 0.2–1.2)
Total Protein: 7.4 g/dL (ref 6.0–8.3)

## 2022-05-05 LAB — LDL CHOLESTEROL, DIRECT: Direct LDL: 99 mg/dL

## 2022-05-05 LAB — LIPASE: Lipase: 16 U/L (ref 11.0–59.0)

## 2022-05-05 LAB — TSH: TSH: 2.41 u[IU]/mL (ref 0.35–5.50)

## 2022-05-05 MED ORDER — HYDROCHLOROTHIAZIDE 12.5 MG PO CAPS: ORAL_CAPSULE | ORAL | 3 refills | Status: AC

## 2022-05-05 NOTE — Progress Notes (Signed)
Subjective  Chief Complaint  Patient presents with   Annual Exam    Pt here for Annual exam and is     HPI: Denise Lin is a 71 y.o. female who presents to [facility] today for a Female Wellness Visit. She also has the concerns and/or needs as listed above in the chief complaint. These will be addressed in addition to the Health Maintenance Visit.   Wellness Visit: annual visit with health maintenance review and exam without Pap  Health maintenance: Mammogram due in October.  Bone density last done in 2019 and was normal.  Colonoscopy up-to-date.  Healthy lifestyle.  Eligible for Prevnar and Shingrix but declines Shingrix.  Counseling education given. Chronic disease f/u and/or acute problem visit: (deemed necessary to be done in addition to the wellness visit): Hypertension with whitecoat syndrome: Now blood pressures have normalized even in the office.  On HCTZ 12.5 daily.  No adverse effects.  No chest pain shortness of breath. Hyperlipidemia but declines treatment.  Eats healthy.  No family history of coronary disease prematurely.  She is fasting for recheck Soft stools: Patient reports over the last 1 to 2 years she has noticed bowel changes.  Mostly stools are softer.  Still goes about once per day or every other day.  No watery stools, no mucus or blood in the stool.  No melena.  She does report that at times her stool seems thinner in caliber but this is not consistent.  No abdominal pain but has noticed increase in gas.  On occasion she will leak soft stool.  She does admit that she eats less fiber and fruit than she used to.  Assessment  1. Annual physical exam   2. White coat syndrome with diagnosis of hypertension   3. Mixed hyperlipidemia   4. Refusal of statin medication by patient   5. Osteopenia of left hip   6. Loose stools      Plan  Female Wellness Visit: Age appropriate Health Maintenance and Prevention measures were discussed with  patient. Included topics are cancer screening recommendations, ways to keep healthy (see AVS) including dietary and exercise recommendations, regular eye and dental care, use of seat belts, and avoidance of moderate alcohol use and tobacco use.  Screens are current BMI: discussed patient's BMI and encouraged positive lifestyle modifications to help get to or maintain a target BMI. HM needs and immunizations were addressed and ordered. See below for orders. See HM and immunization section for updates.  Prevnar 13 updated today.  Patient declines Shingrix. Routine labs and screening tests ordered including cmp, cbc and lipids where appropriate. Discussed recommendations regarding Vit D and calcium supplementation (see AVS)  Chronic disease management visit and/or acute problem visit: Hypertension is controlled check renal function electrolytes.  Continue HCTZ 12.5 mg daily Hyperlipidemia: Recheck fasting lipids.  Last very very elevated, patient will not start medications.  Will monitor.  Eat healthy low-fat diet History of osteopenia with normal bone density on last scan.  Recheck 5 years from last.  Recommend calcium and vitamin D. Loose stools: No symptoms of infection.  No red flag symptoms.  We will try to increase fiber intake and monitor change in stool habits.  If not proving, patient will return.  If decrease in caliber, will refer to GI for colonoscopy.  Follow up: 1 year for complete physical, sooner if bowel symptoms persist Orders Placed This Encounter  Procedures   Pneumococcal conjugate vaccine 13-valent  CBC with Differential/Platelet   Comp Met (CMET)   Lipid Profile   TSH   Lipase   Meds ordered this encounter  Medications   hydrochlorothiazide (MICROZIDE) 12.5 MG capsule    Sig: TAKE 1 CAPSULE(12.5 MG) BY MOUTH DAILY    Dispense:  90 capsule    Refill:  3      Body mass index is 23.43 kg/m. 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 Osteopenia.  Dexa 2021 lowest T - -1.0 femur; NORMAL: recheck 5 years.    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 Followed by [facility]. Benign and stable. (vs fibroadenoma). Never biopsied    Health Maintenance  Topic Date Due   COVID-19 Vaccine (3 - Moderna risk series) 04/08/2020   INFLUENZA VACCINE  06/08/2022   MAMMOGRAM  08/28/2022   COLONOSCOPY (Pts 45-13yr Insurance coverage will need to be confirmed)  11/08/2024   DEXA SCAN  08/26/2025   Pneumonia Vaccine 71 Years old  Completed   Hepatitis C Screening  Completed   HPV VACCINES  Aged Out   TETANUS/TDAP  Discontinued   Zoster Vaccines- Shingrix  Discontinued   Immunization History  Administered Date(s) Administered   Fluad Quad(high Dose 65+) 07/18/2020   Influenza, High Dose Seasonal PF 12/15/2016, 08/28/2018   Influenza,inj,Quad PF,6+ Mos 09/26/2015   Moderna Sars-Covid-2 Vaccination 02/12/2020, 03/11/2020   Pneumococcal Conjugate-13 05/05/2022   Pneumococcal Polysaccharide-23 09/27/2018   We updated and reviewed the patient's past history in detail and it is documented below. Allergies: Patient has No Known Allergies. 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 Past Surgical History Patient  has a past surgical history that includes Uterine fibroid surgery; Nose surgery; Appendectomy; Dilation and curettage of uterus; Breast surgery (Left); Robotic assited partial nephrectomy (Left, 07/19/2014); and Breast cyst excision (Left). Family History: Patient family history includes COPD in her maternal grandfather and mother; Colon cancer (age of onset: 945 in her paternal grandfather; Diabetes in her maternal grandfather; Drug abuse in her brother; Heart failure in her paternal grandmother; Hypertension in her father and son; Lung cancer in her mother; Obesity in her son; Pancreatic cancer in her maternal grandfather; Parkinson's disease in her father; Squamous cell carcinoma in her father; Suicidality in her brother. Social History:  Patient  reports that she has quit smoking. She has never used smokeless tobacco. She reports current alcohol use. She reports that she does not use drugs.  Review of Systems: Constitutional: negative for fever or malaise Ophthalmic: negative for photophobia, double vision or loss of  vision Cardiovascular: negative for chest pain, dyspnea on exertion, or new LE swelling Respiratory: negative for SOB or persistent cough Gastrointestinal: negative for abdominal pain, change in bowel habits or melena Genitourinary: negative for dysuria or gross hematuria, no abnormal uterine bleeding or disharge Musculoskeletal: negative for new gait disturbance or muscular weakness Integumentary: negative for new or persistent rashes, no breast lumps Neurological: negative for TIA or stroke symptoms Psychiatric: negative for SI or delusions Allergic/Immunologic: negative for hives  Patient Care Team    Relationship Specialty Notifications Start End  Leamon Arnt, MD PCP - General Family Medicine  08/28/18      Objective  Vitals: BP 132/78   Pulse 76   Temp 97.8 F (36.6 C)   Ht _0  (1.651 m)   Wt 140 lb 12.8 oz (63.9 kg)   SpO2 97%   BMI 23.43 kg/m  General:  Well developed, well nourished, no acute distress  Psych:  Alert and orientedx3,normal mood and affect HEENT:  Normocephalic, atraumatic, non-icteric sclera,  supple neck without adenopathy, mass or thyromegaly Cardiovascular:  Normal S1, S2, RRR without gallop, rub or murmur Respiratory:  Good breath sounds bilaterally, CTAB with normal respiratory effort Gastrointestinal: normal bowel sounds, soft, non-tender, no noted masses. No HSM MSK: no deformities, contusions. Joints are without erythema or swelling.  Skin:  Warm, no rashes or suspicious lesions noted Neurologic:    Mental status is normal. CN 2-11 are normal. Gross motor and sensory exams are normal. Normal gait. No tremor   Commons side effects, risks, benefits, and alternatives for medications and treatment plan prescribed today were discussed, and the patient expressed understanding of the given instructions. Patient is instructed to call or message via MyChart if he/she has any questions or concerns regarding our treatment plan. No barriers to understanding were identified. We discussed Red Flag symptoms and signs in detail. Patient expressed understanding regarding what to do in case of urgent or emergency type symptoms.  Medication list was reconciled, printed and provided to the patient in AVS. Patient instructions and summary information was reviewed with the patient as documented in the AVS. This note was prepared with assistance of Dragon voice recognition software. Occasional wrong-word or sound-a-like substitutions may have occurred due to the inherent limitations of voice recognition software  This visit occurred during the SARS-CoV-2 public health emergency.  Safety protocols were in place, including screening questions prior to the visit, additional usage of  staff PPE, and extensive cleaning of exam room while observing appropriate contact time as indicated for disinfecting solutions.

## 2022-05-05 NOTE — Patient Instructions (Signed)
Please return in 12 months for your annual complete physical; please come fasting. Sooner if need more help with bowel symptoms.   I will release your lab results to you on your MyChart account with further instructions. You may see the results before I do, but when I review them I will send you a message with my report or have my assistant call you if things need to be discussed. Please reply to my message with any questions. Thank you!   Today you were given your Prevnar-13 vaccination.  This will protect you from certain pneumonias.  Start eating more fiber and let me know if bowel symptoms do not improve.   If you have any questions or concerns, please don't hesitate to send me a message via MyChart or call the office at [phone]. Thank you for visiting with [place] today! It's our pleasure caring for you.   High-Fiber Eating Plan Fiber, also called dietary fiber, is a type of carbohydrate. It is found foods such as fruits, vegetables, whole grains, and beans. A high-fiber diet can have many health benefits. Your health care provider may recommend a high-fiber diet to help: Prevent constipation. Fiber can make your bowel movements more regular. Lower your cholesterol. Relieve the following conditions: Inflammation of veins in the anus (hemorrhoids). Inflammation of specific areas of the digestive tract (uncomplicated diverticulosis). A problem of the large intestine, also called the colon, that sometimes causes pain and diarrhea (irritable bowel syndrome, or IBS). Prevent overeating as part of a weight-loss plan. Prevent heart disease, type 2 diabetes, and certain cancers. What are tips for following this plan? Reading food labels  Check the nutrition facts label on food products for the amount of dietary fiber. Choose foods that have 5 grams of fiber or more per serving. The goals for recommended daily fiber intake include: Men (age 47 or younger): 34-38 g. Men (over age 47): 28-34  g. Women (age 6 or younger): 25-28 g. Women (over age 49): 22-25 g. Your daily fiber goal is _____________ g. Shopping Choose whole fruits and vegetables instead of processed forms, such as apple juice or applesauce. Choose a wide variety of high-fiber foods such as avocados, lentils, oats, and kidney beans. Read the nutrition facts label of the foods you choose. Be aware of foods with added fiber. These foods often have high sugar and sodium amounts per serving. Cooking Use whole-grain flour for baking and cooking. Cook with brown rice instead of white rice. Meal planning Start the day with a breakfast that is high in fiber, such as a cereal that contains 5 g of fiber or more per serving. Eat breads and cereals that are made with whole-grain flour instead of refined flour or white flour. Eat brown rice, bulgur wheat, or millet instead of white rice. Use beans in place of meat in soups, salads, and pasta dishes. Be sure that half of the grains you eat each day are whole grains. General information You can get the recommended daily intake of dietary fiber by: Eating a variety of fruits, vegetables, grains, nuts, and beans. Taking a fiber supplement if you are not able to take in enough fiber in your diet. It is better to get fiber through food than from a supplement. Gradually increase how much fiber you consume. If you increase your intake of dietary fiber too quickly, you may have bloating, cramping, or gas. Drink plenty of water to help you digest fiber. Choose high-fiber snacks, such as berries, raw vegetables, nuts, and  popcorn. What foods should I eat? Fruits Berries. Pears. Apples. Oranges. Avocado. Prunes and raisins. Dried figs. Vegetables Sweet potatoes. Spinach. Kale. Artichokes. Cabbage. Broccoli. Cauliflower. Green peas. Carrots. Squash. Grains Whole-grain breads. Multigrain cereal. Oats and oatmeal. Brown rice. Barley. Bulgur wheat. [place]. Quinoa. Bran muffins. Popcorn.  Rye wafer crackers. Meats and other proteins Navy beans, kidney beans, and pinto beans. Soybeans. Split peas. Lentils. Nuts and seeds. Dairy Fiber-fortified yogurt. Beverages Fiber-fortified soy milk. Fiber-fortified orange juice. Other foods Fiber bars. The items listed above may not be a complete list of recommended foods and beverages. Contact a dietitian for more information. What foods should I avoid? Fruits Fruit juice. Cooked, strained fruit. Vegetables Fried potatoes. Canned vegetables. Well-cooked vegetables. Grains White bread. Pasta made with refined flour. White rice. Meats and other proteins Fatty cuts of meat. Fried chicken or fried fish. Dairy Milk. Yogurt. Cream cheese. Sour cream. Fats and oils Butters. Beverages Soft drinks. Other foods Cakes and pastries. The items listed above may not be a complete list of foods and beverages to avoid. Talk with your dietitian about what choices are best for you. Summary Fiber is a type of carbohydrate. It is found in foods such as fruits, vegetables, whole grains, and beans. A high-fiber diet has many benefits. It can help to prevent constipation, lower blood cholesterol, aid weight loss, and reduce your risk of heart disease, diabetes, and certain cancers. Increase your intake of fiber gradually. Increasing fiber too quickly may cause cramping, bloating, and gas. Drink plenty of water while you increase the amount of fiber you consume. The best sources of fiber include whole fruits and vegetables, whole grains, nuts, seeds, and beans. This information is not intended to replace advice given to you by your health care provider. Make sure you discuss any questions you have with your health care provider. Document Revised: 02/28/2020 Document Reviewed: 02/28/2020 Elsevier Patient Education  [place].

## 2022-05-22 ENCOUNTER — Other Ambulatory Visit: Payer: Self-pay | Admitting: Family Medicine

## 2022-06-22 ENCOUNTER — Emergency Department (HOSPITAL_COMMUNITY)
Admission: EM | Admit: 2022-06-22 | Discharge: 2022-06-22 | Disposition: A | Discharge status: Home / Self Care | Payer: Medicare Other | Attending: Emergency Medicine | Admitting: Emergency Medicine

## 2022-06-22 ENCOUNTER — Other Ambulatory Visit: Payer: Self-pay

## 2022-06-22 ENCOUNTER — Emergency Department (HOSPITAL_COMMUNITY): Payer: Medicare Other

## 2022-06-22 ENCOUNTER — Telehealth: Payer: Self-pay | Admitting: Family Medicine

## 2022-06-22 ENCOUNTER — Encounter (HOSPITAL_COMMUNITY): Payer: Self-pay

## 2022-06-22 DIAGNOSIS — I10 Essential (primary) hypertension: Secondary | ICD-10-CM | POA: Insufficient documentation

## 2022-06-22 DIAGNOSIS — R110 Nausea: Secondary | ICD-10-CM | POA: Diagnosis not present

## 2022-06-22 DIAGNOSIS — R519 Headache, unspecified: Secondary | ICD-10-CM | POA: Diagnosis not present

## 2022-06-22 DIAGNOSIS — R197 Diarrhea, unspecified: Secondary | ICD-10-CM | POA: Diagnosis not present

## 2022-06-22 DIAGNOSIS — R079 Chest pain, unspecified: Secondary | ICD-10-CM | POA: Diagnosis not present

## 2022-06-22 DIAGNOSIS — R0789 Other chest pain: Secondary | ICD-10-CM | POA: Diagnosis not present

## 2022-06-22 LAB — CBC
HCT: 42.2 % (ref 36.0–46.0)
Hemoglobin: 14.1 g/dL (ref 12.0–15.0)
MCH: 29.3 pg (ref 26.0–34.0)
MCHC: 33.4 g/dL (ref 30.0–36.0)
MCV: 87.6 fL (ref 80.0–100.0)
Platelets: 300 10*3/uL (ref 150–400)
RBC: 4.82 MIL/uL (ref 3.87–5.11)
RDW: 13.3 % (ref 11.5–15.5)
WBC: 9.1 10*3/uL (ref 4.0–10.5)
nRBC: 0 % (ref 0.0–0.2)

## 2022-06-22 LAB — BASIC METABOLIC PANEL
Anion gap: 8 (ref 5–15)
BUN: 21 mg/dL (ref 8–23)
CO2: 24 mmol/L (ref 22–32)
Calcium: 9.2 mg/dL (ref 8.9–10.3)
Chloride: 107 mmol/L (ref 98–111)
Creatinine, Ser: 0.76 mg/dL (ref 0.44–1.00)
GFR, Estimated: >60 mL/min (ref >60)
Glucose, Bld: 98 mg/dL (ref 70–99)
Potassium: 3.7 mmol/L (ref 3.5–5.1)
Sodium: 139 mmol/L (ref 135–145)

## 2022-06-22 LAB — TROPONIN I (HIGH SENSITIVITY): Troponin I (High Sensitivity): 2 ng/L (ref <18)

## 2022-06-22 NOTE — ED Provider Triage Note (Signed)
Emergency Medicine Provider Triage Evaluation Note  Denise Lin , a 71 y.o. female  was evaluated in triage.  Pt complains of chest heaviness woke her from sleep at 4:30am, brief, resolved. Used a pulse ox and her HR was "freaking out"- ranging from 99-over 100-then back to 80-90-100.  Called her PCP office who advised she come to the ER. Also diarrhea, nausea, headache.  No heart history.  Review of Systems  Positive: Chest heaviness (brief, resolved), nausea, headache Negative: Vomiting, diaphoresis, SHOB, lower ext edema  Physical Exam  BP (!) 163/97 (BP Location: Right Arm)   Pulse 95   Temp 98.2 F (36.8 C) (Oral)   Resp 18   SpO2 100%  Gen:   Awake, no distress   Resp:  Normal effort  MSK:   Moves extremities without difficulty  Other:    Medical Decision Making  Medically screening exam initiated at 11:40 AM.  Appropriate orders placed.  Kandis Nab was informed that the remainder of the evaluation will be completed by another provider, this initial triage assessment does not replace that evaluation, and the importance of remaining in the ED until their evaluation is complete.     Tacy Learn, PA-C 06/22/22 1143

## 2022-06-22 NOTE — ED Triage Notes (Signed)
Pt reports waking up this morning and having chest pressure. Pt reports she started having a headache shortly after that and then an episode of diarrhea. Pt denies chest pain at this time, but endorses headache.

## 2022-06-22 NOTE — Telephone Encounter (Signed)
Patient states: -She experienced a chest pain she described as "heaviness in the middle" which lasted a few seconds - She has also been having an irregular heart rate and diarrhea   Patient requests: -Recommendations on what to do.  I have sent pt to triage for evaluation.

## 2022-06-22 NOTE — Discharge Instructions (Signed)
Return to the emergency department for any new or recurrent symptoms of concern.

## 2022-06-22 NOTE — Telephone Encounter (Signed)
Patient Name: Denise Lin Gender: Female DOB: 04-Aug-1951 Age: 71 Y 98 M 25 D Return Phone Number: [phone] (Primary), [phone] (Secondary) Address: City/ State/ Zip: [place] [address] Client [facility] Client Site [facility] Provider Billey Chang- MD Contact Type Call Who Is Calling Patient / Member / Family / Caregiver Call Type Triage / Clinical Relationship To Patient Self Return Phone Number [phone] (Primary) Chief Complaint CHEST PAIN - pain, pressure, heaviness or tightness Reason for Call Symptomatic / Request for Health Information Initial Comment Caller states at 4am she had chest pain with heaviness in the middle, irregular heart beat and diarrhea. Caller is being transferred from the office. Translation No Nurse Assessment Nurse: Raphael Gibney, RN, Vanita Ingles Date/Time (Eastern Time): 06/22/2022 9:25:18 AM Confirm and document reason for call. If symptomatic, describe symptoms. ---Caller states she had chest pain in the middle of her chest around 4:30 am. had heaviness. Pain lasted less than a minute. oxygen level 97% and showed irregular heart beat. BP 137/90. had diarrhea this am. pulse is ranging from 75-110. Does the patient have any new or worsening symptoms? ---Yes Will a triage be completed? ---Yes Related visit to physician within the last 2 weeks? ---No Does the PT have any chronic conditions? (i.e. diabetes, asthma, this includes High risk factors for pregnancy, etc.) ---Yes List chronic conditions. ---HTN Is this a behavioral health or substance abuse call? ---No Guidelines Guideline Title Affirmed Question Affirmed Notes Nurse Date/Time (Eastern Time) Chest Pain Patient sounds very sick or weak to the triager Raphael Gibney, RN, Vanita Ingles 06/22/2022 9:28:38 AM PLEASE NOTE: All timestamps contained within this report are represented as [place] Standard Time. CONFIDENTIALTY  NOTICE: This fax transmission is intended only for the addressee. It contains information that is legally privileged, confidential or otherwise protected from use or disclosure. If you are not the intended recipient, you are strictly prohibited from reviewing, disclosing, copying using or disseminating any of this information or taking any action in reliance on or regarding this information. If you have received this fax in error, please notify us immediately by telephone so that we can arrange for its return to [place]. Phone: [phone], Toll-Free: [phone], Fax: [phone] Page: 2 of 2 Call Id: [id number] [place]. Time Eilene Ghazi Time) Disposition Final User 06/22/2022 9:24:26 AM Send to Urgent [facility] 06/22/2022 9:36:07 AM Go to ED Now (or PCP triage) Yes Raphael Gibney, RN, Vanita Ingles Final Disposition 06/22/2022 9:36:07 AM Go to ED Now (or PCP triage) Yes Raphael Gibney, RN, Doreatha Lew Disagree/Comply Comply Caller Understands Yes PreDisposition Call Doctor Care Advice Given Per Guideline GO TO ED NOW (OR PCP TRIAGE): ANOTHER ADULT SHOULD DRIVE: * It is better and safer if another adult drives instead of you. CALL EMS IF: * Severe difficulty breathing occurs * Passes out or becomes too weak to stand * You become worse CARE ADVICE given per Chest Pain (Adult) guideline. Referrals [facility] - ED

## 2022-06-22 NOTE — ED Provider Notes (Signed)
[facility] DEPT Provider Note   CSN: [id number] Arrival date & time: 06/22/22  1058     History  Chief Complaint  Patient presents with   Chest Pain   Headache    Denise Lin is a 71 y.o. female.   Chest Pain Associated symptoms: headache   Headache Associated symptoms: diarrhea   Patient presents for a very brief episode of chest pain.  Medical history includes HLD, osteopenia, HTN, anemia, GERD.  Patient reports that she was in her normal state of health last night.  This morning, at approximately 4:30 AM, she awoke to a tightness in her lower chest/epigastrium.  This tightness lasted for less than 1 minute.  She did have an episode of diarrhea and a headache at the time.  The symptoms also resolved.  She checked her pulse with a fingertip pulse oximeter.  She states that she was having variations in her heart rate.  Maximum heart rate was in the 100s.  Patient reports that she has been asymptomatic since her arrival in the ED this morning.     Home Medications Prior to Admission medications   Medication Sig Start Date End Date Taking? Authorizing Provider  hydrochlorothiazide (MICROZIDE) 12.5 MG capsule TAKE 1 CAPSULE(12.5 MG) BY MOUTH DAILY 05/05/22   Leamon Arnt, MD  Multiple Vitamin (MULTIVITAMIN ADULT PO) Take by mouth. Women's 50+ multivitamin from [facility]    [provider]      Allergies    Patient has no known allergies.    Review of Systems   Review of Systems  Cardiovascular:  Positive for chest pain.  Gastrointestinal:  Positive for diarrhea.  Neurological:  Positive for headaches.  All other systems reviewed and are negative.   Physical Exam Updated Vital Signs BP (!) 143/78   Pulse 70   Temp 98.1 F (36.7 C) (Oral)   Resp 14   SpO2 98%  Physical Exam Vitals and nursing note reviewed.  Constitutional:      General: She is not in acute distress.    Appearance: She is well-developed. She is not ill-appearing,  toxic-appearing or diaphoretic.  HENT:     Head: Normocephalic and atraumatic.  Eyes:     Conjunctiva/sclera: Conjunctivae normal.  Neck:     Vascular: No JVD.  Cardiovascular:     Rate and Rhythm: Normal rate and regular rhythm.     Heart sounds: No murmur heard.    No gallop.  Pulmonary:     Effort: Pulmonary effort is normal. No respiratory distress.     Breath sounds: Normal breath sounds. No decreased breath sounds, wheezing, rhonchi or rales.  Chest:     Chest wall: No tenderness.  Abdominal:     Palpations: Abdomen is soft.     Tenderness: There is no abdominal tenderness.  Musculoskeletal:        General: No swelling.     Cervical back: Normal range of motion and neck supple.     Right lower leg: No edema.     Left lower leg: No edema.  Skin:    General: Skin is warm and dry.     Capillary Refill: Capillary refill takes less than 2 seconds.     Coloration: Skin is not cyanotic or pale.  Neurological:     General: No focal deficit present.     Mental Status: She is alert and oriented to person, place, and time.     Cranial Nerves: No cranial nerve deficit.  Motor: No weakness.  Psychiatric:        Mood and Affect: Mood normal.        Behavior: Behavior normal.     ED Results / Procedures / Treatments   Labs (all labs ordered are listed, but only abnormal results are displayed) Labs Reviewed  BASIC METABOLIC PANEL  CBC  LIPASE, BLOOD  HEPATIC FUNCTION PANEL  MAGNESIUM  TROPONIN I (HIGH SENSITIVITY)  TROPONIN I (HIGH SENSITIVITY)    EKG EKG Interpretation  Date/Time:  Tuesday June 22 2022 11:38:13 EDT Ventricular Rate:  95 PR Interval:  210 QRS Duration: 90 QT Interval:  362 QTC Calculation: 456 R Axis:   71 Text Interpretation: Sinus rhythm Borderline prolonged PR interval Biatrial enlargement Confirmed by Godfrey Pick [phone]) on 06/22/2022 5:29:51 PM  Radiology DG Chest 2 View  Result Date: 06/22/2022 CLINICAL DATA:  Chest pain EXAM: CHEST -  2 VIEW COMPARISON:  Chest 08/29/2018 FINDINGS: The heart size and mediastinal contours are within normal limits. Both lungs are clear. The visualized skeletal structures are unremarkable. IMPRESSION: No active cardiopulmonary disease. Electronically Signed   By: Franchot Gallo M.D.   On: 06/22/2022 11:59    Procedures Procedures    Medications Ordered in ED Medications - No data to display  ED Course/ Medical Decision Making/ A&P                           Medical Decision Making Amount and/or Complexity of Data Reviewed Labs: ordered. Radiology: ordered.   This patient presents to the ED for concern of chest pain, this involves an extensive number of treatment options, and is a complaint that carries with it a high risk of complications and morbidity.  The differential diagnosis includes ACS, GERD, gastritis, costochondritis   Co morbidities that complicate the patient evaluation  HLD, osteopenia, HTN, anemia, GERD   Additional history obtained:  Additional history obtained from N/A External records from outside source obtained and reviewed including EMR   Lab Tests:  I Ordered, and personally interpreted labs.  The pertinent results include: Normal electrolytes, normal troponin, normal hemoglobin, no leukocytosis   Imaging Studies ordered:  I ordered imaging studies including chest x-ray I independently visualized and interpreted imaging which showed no acute findings I agree with the radiologist interpretation   Cardiac Monitoring: / EKG:  The patient was maintained on a cardiac monitor.  I personally viewed and interpreted the cardiac monitored which showed an underlying rhythm of: Sinus rhythm  Problem List / ED Course / Critical interventions / Medication management  Patient is a healthy 71 year old female presenting for a very brief transient episode of chest pain that occurred this morning at approximately 4:30 AM.  At the time, she had associated diarrhea,  nausea, and headache.  All symptoms have resolved.  She estimates that her chest pain lasted for less than 1 minute.  She read in the ED several hours prior to being bedded.  She states that throughout her time in the ED waiting room, she has been asymptomatic.  Initial lab work shows no abnormal findings.  Troponin is 2.  EKG shows normal sinus rhythm with no ST or T wave abnormalities.  There is no evidence of arrhythmia on cardiac monitor.  Patient was informed of work-up results and does feel comfortable with discharge home.  Social Determinants of Health:  Has access to outpatient care         Final Clinical Impression(s) / ED Diagnoses  Final diagnoses:  Chest pain, unspecified type    Rx / DC Orders ED Discharge Orders     None         Godfrey Pick, MD 06/22/22 1805

## 2022-08-02 ENCOUNTER — Encounter: Payer: Self-pay | Admitting: *Deleted

## 2022-08-09 DIAGNOSIS — C44612 Basal cell carcinoma of skin of right upper limb, including shoulder: Secondary | ICD-10-CM | POA: Diagnosis not present

## 2022-08-09 DIAGNOSIS — D2271 Melanocytic nevi of right lower limb, including hip: Secondary | ICD-10-CM | POA: Diagnosis not present

## 2022-08-09 DIAGNOSIS — L821 Other seborrheic keratosis: Secondary | ICD-10-CM | POA: Diagnosis not present

## 2022-08-09 DIAGNOSIS — X32XXXS Exposure to sunlight, sequela: Secondary | ICD-10-CM | POA: Diagnosis not present

## 2022-08-09 DIAGNOSIS — L814 Other melanin hyperpigmentation: Secondary | ICD-10-CM | POA: Diagnosis not present

## 2022-08-09 DIAGNOSIS — L820 Inflamed seborrheic keratosis: Secondary | ICD-10-CM | POA: Diagnosis not present

## 2022-08-09 DIAGNOSIS — D485 Neoplasm of uncertain behavior of skin: Secondary | ICD-10-CM | POA: Diagnosis not present

## 2022-10-12 ENCOUNTER — Other Ambulatory Visit: Payer: Self-pay | Admitting: Family Medicine

## 2022-10-12 DIAGNOSIS — Z1231 Encounter for screening mammogram for malignant neoplasm of breast: Secondary | ICD-10-CM

## 2022-10-21 ENCOUNTER — Encounter: Payer: Self-pay | Admitting: *Deleted

## 2022-12-07 ENCOUNTER — Ambulatory Visit
Admission: RE | Admit: 2022-12-07 | Discharge: 2022-12-07 | Disposition: A | Discharge status: Home / Self Care | Payer: Medicare Other | Source: Ambulatory Visit | Attending: Family Medicine | Admitting: Family Medicine

## 2022-12-07 DIAGNOSIS — Z1231 Encounter for screening mammogram for malignant neoplasm of breast: Secondary | ICD-10-CM | POA: Diagnosis not present

## 2023-01-20 ENCOUNTER — Telehealth: Payer: Self-pay | Admitting: Family Medicine

## 2023-01-20 NOTE — Telephone Encounter (Signed)
Contacted Denise Lin to schedule their annual wellness visit. Appointment made for 01/31/2023.  [place] Direct Dial [phone]

## 2023-01-31 ENCOUNTER — Ambulatory Visit (INDEPENDENT_AMBULATORY_CARE_PROVIDER_SITE_OTHER): Payer: Medicare Other

## 2023-01-31 VITALS — BP 153/69 | HR 79 | Temp 97.7°F | Wt 144.0 lb

## 2023-01-31 DIAGNOSIS — Z0000 Encounter for general adult medical examination without abnormal findings: Secondary | ICD-10-CM

## 2023-01-31 NOTE — Progress Notes (Addendum)
Subjective:   Denise Lin is a 72 y.o. female who presents for Medicare Annual (Subsequent) preventive examination.  Review of Systems     Cardiac Risk Factors include: advanced age (>71men, >57 women);dyslipidemia;hypertension     Objective:    Today's Vitals   01/31/23 1326 01/31/23 1335  BP: (!) 150/74 (!) 153/69  Pulse: 79   Temp: 97.7 F (36.5 C)   SpO2: 95%   Weight: 144 lb (65.3 kg)    Body mass index is 23.96 kg/m.     01/31/2023    1:40 PM 06/22/2022   11:42 AM 01/22/2022    8:16 AM 03/17/2020    9:03 AM 07/19/2014    7:11 PM 07/19/2014   11:36 AM 07/11/2014    1:14 PM  Advanced Directives  Does Patient Have a Medical Advance Directive? No No Yes Yes Yes  Yes  Type of Advance Directive    Living will;Healthcare Power of [place];Living will  Does patient want to make changes to medical advance directive?   Yes (MAU/Ambulatory/Procedural Areas - Information given) No - Patient declined No - Patient declined No - Patient declined No - Patient declined  Copy of [place] in Chart?    No - copy requested Yes  No - copy requested  Would patient like information on creating a medical advance directive? No - Patient declined No - Patient declined         Current Medications (verified) Outpatient Encounter Medications as of 01/31/2023  Medication Sig   hydrochlorothiazide (MICROZIDE) 12.5 MG capsule TAKE 1 CAPSULE(12.5 MG) BY MOUTH DAILY   Multiple Vitamin (MULTIVITAMIN ADULT PO) Take by mouth. Women's 50+ multivitamin from [facility]   No facility-administered encounter medications on file as of 01/31/2023.    Allergies (verified) Patient has no known allergies.   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   Removed 2015, Dr Fay Records, urology   Bell's palsy    recent    Endometriosis    GERD (gastroesophageal reflux  disease)    Hematuria    recent    Hypertension    Osteopenia    Seizures ([place])    as an infant no problems since    Uterine polyp    Past Surgical History:  Procedure Laterality Date   APPENDECTOMY     BREAST CYST EXCISION Left    years ago 1990's   BREAST SURGERY Left    benign tumor removed from breast years ago   [place]     x 2 for endomtriosis and polyp   NOSE SURGERY     ROBOTIC ASSITED PARTIAL NEPHRECTOMY Left 07/19/2014   Procedure: ROBOTIC ASSITED PARTIAL NEPHRECTOMY, LAPAROSCOPIC ADHESIOLYSIS;  Surgeon: Alexis Frock, MD;  Location: [facility];  Service: Urology;  Laterality: Left;   UTERINE FIBROID SURGERY     Family History  Problem Relation Age of Onset   Lung cancer Mother    COPD Mother    Parkinson's disease Father    Hypertension Father    Squamous cell carcinoma Father        skin   COPD Maternal Grandfather    Diabetes Maternal Grandfather    Pancreatic cancer Maternal Grandfather    Heart failure Paternal Grandmother    Colon cancer Paternal Grandfather 33   Suicidality Brother    Drug abuse Brother  Obesity Son    Hypertension Son    Breast cancer Neg Hx    Social History   Socioeconomic History   Marital status: Married    Spouse name: Not on file   Number of children: 2   Years of education: Not on file   Highest education level: Not on file  Occupational History   Occupation: Retired Scientist, research (medical), Teacher, adult education, Passenger transport manager and others    Comment: retired in 2011  Tobacco Use   Smoking status: Former   Smokeless tobacco: Never  Scientific laboratory technician Use: Never used  Substance and Sexual Activity   Alcohol use: Yes    Comment: 1-2 glasses of wine daily    Drug use: No   Sexual activity: Yes    Birth control/protection: Post-menopausal  Other Topics Concern   Not on file  Social History Narrative   3 grands   Social Determinants of Health   Financial Resource Strain: [place]  (01/31/2023)   Overall Financial Resource  Strain (CARDIA)    Difficulty of Paying Living Expenses: Not hard at all  Food Insecurity: No Food Insecurity (01/31/2023)   Hunger Vital Sign    Worried About Running Out of Food in the Last Year: Never true    [place] in the Last Year: Never true  Transportation Needs: No Transportation Needs (01/31/2023)   PRAPARE - Hydrologist (Medical): No    Lack of Transportation (Non-Medical): No  Physical Activity: Sufficiently Active (01/31/2023)   Exercise Vital Sign    Days of Exercise per Week: 6 days    Minutes of Exercise per Session: 120 min  Stress: No Stress Concern Present (01/31/2023)   [place]    Feeling of Stress : Not at all  Social Connections: [place] (01/31/2023)   Social Connection and Isolation Panel [NHANES]    Frequency of Communication with Friends and Family: More than three times a week    Frequency of Social Gatherings with Friends and Family: Not on file    Attends Religious Services: 1 to 4 times per year    Active Member of [employer] or Organizations: Yes    Attends Music therapist: Not on file    Marital Status: Married    Tobacco Counseling Counseling given: Not Answered   Clinical Intake:  Pre-visit preparation completed: Yes  Pain : No/denies pain     BMI - recorded: 23.96 Nutritional Status: BMI of 19-24  Normal Nutritional Risks: None Diabetes: No  How often do you need to have someone help you when you read instructions, pamphlets, or other written materials from your doctor or pharmacy?: 1 - Never  Diabetic?no  Interpreter Needed?: No  Information entered by :: Charlott Rakes, LPN   Activities of Daily Living    01/31/2023    1:41 PM  In your present state of health, do you have any difficulty performing the following activities:  Hearing? 0  Vision? 0  Difficulty concentrating or making decisions? 0  Walking  or climbing stairs? 0  Dressing or bathing? 0  Doing errands, shopping? 0  Preparing Food and eating ? N  Using the Toilet? N  In the past six months, have you accidently leaked urine? Y  Comment wears poise pads  Do you have problems with loss of bowel control? Y  Comment had a few accidents after milk products  Managing your Medications? N  Managing your  Finances? N  Housekeeping or managing your Housekeeping? N    Patient Care Team: Leamon Arnt, MD as PCP - General (Family Medicine)  Indicate any recent Medical Services you may have received from other than [facility] providers in the past year (date may be approximate).     Assessment:   This is a routine wellness examination for [facility].  Hearing/Vision screen Hearing Screening - Comments:: Pt denies any hearing issues  Vision Screening - Comments:: Pt follows up with my eye Dr for annual eye exams  Dietary issues and exercise activities discussed: Current Exercise Habits: Home exercise routine, Type of exercise: yoga;stretching (bowling and hiking), Time (Minutes): > 60, Frequency (Times/Week): 6, Weekly Exercise (Minutes/Week): 0   Goals Addressed             This Visit's Progress    Patient Stated       Continue exercise        Depression Screen    01/31/2023    1:38 PM 05/05/2022    8:55 AM 01/22/2022    8:15 AM 01/19/2021    8:31 AM 03/17/2020    9:04 AM 03/17/2020    8:21 AM 09/27/2018    8:15 AM  PHQ 2/9 Scores  PHQ - 2 Score 0 0 0 0 0 0 0    Fall Risk    01/31/2023    1:41 PM 05/05/2022    8:55 AM 01/22/2022    8:22 AM 04/30/2021    8:56 AM 03/17/2020    9:04 AM  [place] in the past year? 0 0 0 0 0  Number falls in past yr: 0 0 0 0 0  Injury with Fall? 0 0 0 0 0  Risk for fall due to : Impaired vision No Fall Risks Impaired vision    Follow up Falls prevention discussed Falls evaluation completed Falls prevention discussed Falls evaluation completed Falls evaluation completed;Education  provided;Falls prevention discussed    FALL RISK PREVENTION PERTAINING TO THE HOME:  Any stairs in or around the home? No  If so, are there any without handrails? No  Home free of loose throw rugs in walkways, pet beds, electrical cords, etc? Yes  Adequate lighting in your home to reduce risk of falls? Yes   ASSISTIVE DEVICES UTILIZED TO PREVENT FALLS:  Life alert? No  Use of a cane, walker or w/c? No  Grab bars in the bathroom? No  Shower chair or bench in shower? No  Elevated toilet seat or a handicapped toilet? No   TIMED UP AND GO:  Was the test performed? Yes .  Length of time to ambulate 10 feet: 10 sec.   Gait steady and fast without use of assistive device  Cognitive Function:        01/31/2023    1:42 PM 01/22/2022    8:26 AM 03/17/2020    9:04 AM  6CIT Screen  What Year? 0 points 0 points 0 points  What month? 0 points 0 points 0 points  What time? 3 points 0 points 0 points  Count back from 20 0 points 0 points 0 points  Months in reverse 0 points 0 points 0 points  Repeat phrase 0 points 2 points 0 points  Total Score 3 points 2 points 0 points    Immunizations Immunization History  Administered Date(s) Administered   Fluad Quad(high Dose 65+) 07/18/2020   Influenza, High Dose Seasonal PF 12/15/2016, 08/28/2018   Influenza,inj,Quad PF,6+ Mos 09/26/2015  Moderna Sars-Covid-2 Vaccination 02/12/2020, 03/11/2020   Pneumococcal Conjugate-13 05/05/2022   Pneumococcal Polysaccharide-23 09/27/2018    TDAP status: Due, Education has been provided regarding the importance of this vaccine. Advised may receive this vaccine at local pharmacy or Health Dept. Aware to provide a copy of the vaccination record if obtained from local pharmacy or Health Dept. Verbalized acceptance and understanding.  Flu Vaccine status: Due, Education has been provided regarding the importance of this vaccine. Advised may receive this vaccine at local pharmacy or Health Dept. Aware to  provide a copy of the vaccination record if obtained from local pharmacy or Health Dept. Verbalized acceptance and understanding.  Pneumococcal vaccine status: Up to date  Covid-19 vaccine status: Completed vaccines  Qualifies for Shingles Vaccine? No   Zostavax completed No     Screening Tests Health Maintenance  Topic Date Due   DTaP/Tdap/Td (1 - Tdap) Never done   INFLUENZA VACCINE  06/08/2022   COVID-19 Vaccine (3 - 2023-24 season) 07/09/2022   MAMMOGRAM  12/08/2023   Medicare Annual Wellness (AWV)  01/31/2024   COLONOSCOPY (Pts 45-13yrs Insurance coverage will need to be confirmed)  11/08/2024   DEXA SCAN  08/26/2025   Pneumonia Vaccine 53+ Years old  Completed   Hepatitis C Screening  Completed   HPV VACCINES  Aged Out   Zoster Vaccines- Shingrix  Discontinued    Health Maintenance  Health Maintenance Due  Topic Date Due   DTaP/Tdap/Td (1 - Tdap) Never done   INFLUENZA VACCINE  06/08/2022   COVID-19 Vaccine (3 - 2023-24 season) 07/09/2022    Colorectal cancer screening: Type of screening: Colonoscopy. Completed 11/08/14. Repeat every 10 years  Mammogram status: Completed 12/07/22. Repeat every year  Bone Density status: Completed 08/26/20. Results reflect: Bone density results: OSTEOPENIA. Repeat every 5 years.  Lung Cancer Screening: (Low Dose CT Chest recommended if Age 74-80 years, 30 pack-year currently smoking OR have quit w/in 15years.) does not qualify.     Additional Screening:  Hepatitis C Screening:  Completed 09/27/18  Vision Screening: Recommended annual ophthalmology exams for early detection of glaucoma and other disorders of the eye. Is the patient up to date with their annual eye exam?  Yes  Who is the provider or what is the name of the office in which the patient attends annual eye exams? My eye dr  If pt is not established with a provider, would they like to be referred to a provider to establish care? No .   Dental Screening: Recommended  annual dental exams for proper oral hygiene  Community Resource Referral / Chronic Care Management: CRR required this visit?  No   CCM required this visit?  No      Plan:     I have personally reviewed and noted the following in the patient's chart:   Medical and social history Use of alcohol, tobacco or illicit drugs  Current medications and supplements including opioid prescriptions. Patient is not currently taking opioid prescriptions. Functional ability and status Nutritional status Physical activity Advanced directives List of other physicians Hospitalizations, surgeries, and ER visits in previous 12 months Vitals Screenings to include cognitive, depression, and falls Referrals and appointments  In addition, I have reviewed and discussed with patient certain preventive protocols, quality metrics, and best practice recommendations. A written personalized care plan for preventive services as well as general preventive health recommendations were provided to patient.     Willette Brace, LPN   [id number]   Nurse Notes: pt given a  blood pressure log to keep of blood pressure in a.m and p.m

## 2023-01-31 NOTE — Patient Instructions (Signed)
Ms. Denise Lin , Thank you for taking time to come for your Medicare Wellness Visit. I appreciate your ongoing commitment to your health goals. Please review the following plan we discussed and let me know if I can assist you in the future.   These are the goals we discussed:  Goals      Patient Stated     Continue to exercise to get to 3 times a week      Patient Stated     Continue exercise         This is a list of the screening recommended for you and due dates:  Health Maintenance  Topic Date Due   DTaP/Tdap/Td vaccine (1 - Tdap) Never done   Flu Shot  06/08/2022   COVID-19 Vaccine (3 - 2023-24 season) 07/09/2022   Mammogram  12/08/2023   Medicare Annual Wellness Visit  01/31/2024   Colon Cancer Screening  11/08/2024   DEXA scan (bone density measurement)  08/26/2025   Pneumonia Vaccine  Completed   Hepatitis C Screening: USPSTF Recommendation to screen - Ages 32-79 yo.  Completed   HPV Vaccine  Aged Out   Zoster (Shingles) Vaccine  Discontinued    Advanced directives: Advance directive discussed with you today. I have provided a copy for you to complete at home and have notarized. Once this is complete please bring a copy in to our office so we can scan it into your chart.  Conditions/risks identified: exercise more   Next appointment: Follow up in one year for your annual wellness visit    Preventive Care 65 Years and Older, Female Preventive care refers to lifestyle choices and visits with your health care provider that can promote health and wellness. What does preventive care include? A yearly physical exam. This is also called an annual well check. Dental exams once or twice a year. Routine eye exams. Ask your health care provider how often you should have your eyes checked. Personal lifestyle choices, including: Daily care of your teeth and gums. Regular physical activity. Eating a healthy diet. Avoiding tobacco and drug use. Limiting alcohol use. Practicing  safe sex. Taking low-dose aspirin every day. Taking vitamin and mineral supplements as recommended by your health care provider. What happens during an annual well check? The services and screenings done by your health care provider during your annual well check will depend on your age, overall health, lifestyle risk factors, and family history of disease. Counseling  Your health care provider may ask you questions about your: Alcohol use. Tobacco use. Drug use. Emotional well-being. Home and relationship well-being. Sexual activity. Eating habits. History of falls. Memory and ability to understand (cognition). Work and work Statistician. Reproductive health. Screening  You may have the following tests or measurements: Height, weight, and BMI. Blood pressure. Lipid and cholesterol levels. These may be checked every 5 years, or more frequently if you are over 70 years old. Skin check. Lung cancer screening. You may have this screening every year starting at age 79 if you have a 30-pack-year history of smoking and currently smoke or have quit within the past 15 years. Fecal occult blood test (FOBT) of the stool. You may have this test every year starting at age 16. Flexible sigmoidoscopy or colonoscopy. You may have a sigmoidoscopy every 5 years or a colonoscopy every 10 years starting at age 61. Hepatitis C blood test. Hepatitis B blood test. Sexually transmitted disease (STD) testing. Diabetes screening. This is done by checking your blood sugar (glucose)  after you have not eaten for a while (fasting). You may have this done every 1-3 years. Bone density scan. This is done to screen for osteoporosis. You may have this done starting at age 77. Mammogram. This may be done every 1-2 years. Talk to your health care provider about how often you should have regular mammograms. Talk with your health care provider about your test results, treatment options, and if necessary, the need for more  tests. Vaccines  Your health care provider may recommend certain vaccines, such as: Influenza vaccine. This is recommended every year. Tetanus, diphtheria, and acellular pertussis (Tdap, Td) vaccine. You may need a Td booster every 10 years. Zoster vaccine. You may need this after age 45. Pneumococcal 13-valent conjugate (PCV13) vaccine. One dose is recommended after age 32. Pneumococcal polysaccharide (PPSV23) vaccine. One dose is recommended after age 37. Talk to your health care provider about which screenings and vaccines you need and how often you need them. This information is not intended to replace advice given to you by your health care provider. Make sure you discuss any questions you have with your health care provider. Document Released: 11/21/2015 Document Revised: 07/14/2016 Document Reviewed: 08/26/2015 Elsevier Interactive Patient Education  2017 [place] Prevention in the Home Falls can cause injuries. They can happen to people of all ages. There are many things you can do to make your home safe and to help prevent falls. What can I do on the outside of my home? Regularly fix the edges of walkways and driveways and fix any cracks. Remove anything that might make you trip as you walk through a door, such as a raised step or threshold. Trim any bushes or trees on the path to your home. Use bright outdoor lighting. Clear any walking paths of anything that might make someone trip, such as rocks or tools. Regularly check to see if handrails are loose or broken. Make sure that both sides of any steps have handrails. Any raised decks and porches should have guardrails on the edges. Have any leaves, snow, or ice cleared regularly. Use sand or salt on walking paths during winter. Clean up any spills in your garage right away. This includes oil or grease spills. What can I do in the bathroom? Use night lights. Install grab bars by the toilet and in the tub and shower.  Do not use towel bars as grab bars. Use non-skid mats or decals in the tub or shower. If you need to sit down in the shower, use a plastic, non-slip stool. Keep the floor dry. Clean up any water that spills on the floor as soon as it happens. Remove soap buildup in the tub or shower regularly. Attach bath mats securely with double-sided non-slip rug tape. Do not have throw rugs and other things on the floor that can make you trip. What can I do in the bedroom? Use night lights. Make sure that you have a light by your bed that is easy to reach. Do not use any sheets or blankets that are too big for your bed. They should not hang down onto the floor. Have a firm chair that has side arms. You can use this for support while you get dressed. Do not have throw rugs and other things on the floor that can make you trip. What can I do in the kitchen? Clean up any spills right away. Avoid walking on wet floors. Keep items that you use a lot in easy-to-reach places. If  you need to reach something above you, use a strong step stool that has a grab bar. Keep electrical cords out of the way. Do not use floor polish or wax that makes floors slippery. If you must use wax, use non-skid floor wax. Do not have throw rugs and other things on the floor that can make you trip. What can I do with my stairs? Do not leave any items on the stairs. Make sure that there are handrails on both sides of the stairs and use them. Fix handrails that are broken or loose. Make sure that handrails are as long as the stairways. Check any carpeting to make sure that it is firmly attached to the stairs. Fix any carpet that is loose or worn. Avoid having throw rugs at the top or bottom of the stairs. If you do have throw rugs, attach them to the floor with carpet tape. Make sure that you have a light switch at the top of the stairs and the bottom of the stairs. If you do not have them, ask someone to add them for you. What else  can I do to help prevent falls? Wear shoes that: Do not have high heels. Have rubber bottoms. Are comfortable and fit you well. Are closed at the toe. Do not wear sandals. If you use a stepladder: Make sure that it is fully opened. Do not climb a closed stepladder. Make sure that both sides of the stepladder are locked into place. Ask someone to hold it for you, if possible. Clearly mark and make sure that you can see: Any grab bars or handrails. First and last steps. Where the edge of each step is. Use tools that help you move around (mobility aids) if they are needed. These include: Canes. Walkers. Scooters. Crutches. Turn on the lights when you go into a dark area. Replace any light bulbs as soon as they burn out. Set up your furniture so you have a clear path. Avoid moving your furniture around. If any of your floors are uneven, fix them. If there are any pets around you, be aware of where they are. Review your medicines with your doctor. Some medicines can make you feel dizzy. This can increase your chance of falling. Ask your doctor what other things that you can do to help prevent falls. This information is not intended to replace advice given to you by your health care provider. Make sure you discuss any questions you have with your health care provider. Document Released: 08/21/2009 Document Revised: 04/01/2016 Document Reviewed: 11/29/2014 Elsevier Interactive Patient Education  2017 [employer].

## 2023-02-07 DIAGNOSIS — Z85828 Personal history of other malignant neoplasm of skin: Secondary | ICD-10-CM | POA: Diagnosis not present

## 2023-02-07 DIAGNOSIS — L821 Other seborrheic keratosis: Secondary | ICD-10-CM | POA: Diagnosis not present

## 2023-02-07 DIAGNOSIS — L218 Other seborrheic dermatitis: Secondary | ICD-10-CM | POA: Diagnosis not present

## 2023-02-07 DIAGNOSIS — Z85820 Personal history of malignant melanoma of skin: Secondary | ICD-10-CM | POA: Diagnosis not present

## 2023-05-09 ENCOUNTER — Encounter: Payer: Self-pay | Admitting: Family Medicine

## 2023-05-09 ENCOUNTER — Ambulatory Visit (INDEPENDENT_AMBULATORY_CARE_PROVIDER_SITE_OTHER): Payer: Medicare Other | Admitting: Family Medicine

## 2023-05-09 VITALS — BP 134/78 | HR 74 | Ht 65.0 in | Wt 140.4 lb

## 2023-05-09 DIAGNOSIS — E781 Pure hyperglyceridemia: Secondary | ICD-10-CM

## 2023-05-09 DIAGNOSIS — L219 Seborrheic dermatitis, unspecified: Secondary | ICD-10-CM | POA: Insufficient documentation

## 2023-05-09 DIAGNOSIS — E739 Lactose intolerance, unspecified: Secondary | ICD-10-CM | POA: Diagnosis not present

## 2023-05-09 DIAGNOSIS — S4991XS Unspecified injury of right shoulder and upper arm, sequela: Secondary | ICD-10-CM | POA: Diagnosis not present

## 2023-05-09 DIAGNOSIS — E782 Mixed hyperlipidemia: Secondary | ICD-10-CM

## 2023-05-09 DIAGNOSIS — I10 Essential (primary) hypertension: Secondary | ICD-10-CM

## 2023-05-09 DIAGNOSIS — Z0000 Encounter for general adult medical examination without abnormal findings: Secondary | ICD-10-CM

## 2023-05-09 DIAGNOSIS — Z5320 Procedure and treatment not carried out because of patient's decision for unspecified reasons: Secondary | ICD-10-CM | POA: Diagnosis not present

## 2023-05-09 DIAGNOSIS — K7689 Other specified diseases of liver: Secondary | ICD-10-CM

## 2023-05-09 DIAGNOSIS — E559 Vitamin D deficiency, unspecified: Secondary | ICD-10-CM | POA: Diagnosis not present

## 2023-05-09 LAB — CBC WITH DIFFERENTIAL/PLATELET
Basophils Absolute: 0.1 10*3/uL (ref 0.0–0.1)
Basophils Relative: 1 % (ref 0.0–3.0)
Eosinophils Absolute: 0.1 10*3/uL (ref 0.0–0.7)
Eosinophils Relative: 1.5 % (ref 0.0–5.0)
HCT: 41.5 % (ref 36.0–46.0)
Hemoglobin: 13.3 g/dL (ref 12.0–15.0)
Lymphocytes Relative: 29.7 % (ref 12.0–46.0)
Lymphs Abs: 1.9 10*3/uL (ref 0.7–4.0)
MCHC: 32.1 g/dL (ref 30.0–36.0)
MCV: 88.6 fl (ref 78.0–100.0)
Monocytes Absolute: 0.4 10*3/uL (ref 0.1–1.0)
Monocytes Relative: 6 % (ref 3.0–12.0)
Neutro Abs: 3.9 10*3/uL (ref 1.4–7.7)
Neutrophils Relative %: 61.8 % (ref 43.0–77.0)
Platelets: 302 10*3/uL (ref 150.0–400.0)
RBC: 4.68 Mil/uL (ref 3.87–5.11)
RDW: 14.2 % (ref 11.5–15.5)
WBC: 6.3 10*3/uL (ref 4.0–10.5)

## 2023-05-09 LAB — COMPREHENSIVE METABOLIC PANEL
ALT: 26 U/L (ref 0–35)
AST: 18 U/L (ref 0–37)
Albumin: 4.6 g/dL (ref 3.5–5.2)
Alkaline Phosphatase: 84 U/L (ref 39–117)
BUN: 14 mg/dL (ref 6–23)
CO2: 26 mEq/L (ref 19–32)
Calcium: 9.5 mg/dL (ref 8.4–10.5)
Chloride: 107 mEq/L (ref 96–112)
Creatinine, Ser: 0.62 mg/dL (ref 0.40–1.20)
GFR: 88.28 mL/min (ref >60.00)
Glucose, Bld: 101 mg/dL — ABNORMAL HIGH (ref 70–99)
Potassium: 4.2 mEq/L (ref 3.5–5.1)
Sodium: 144 mEq/L (ref 135–145)
Total Bilirubin: 0.7 mg/dL (ref 0.2–1.2)
Total Protein: 7.2 g/dL (ref 6.0–8.3)

## 2023-05-09 LAB — LIPID PANEL
Cholesterol: 204 mg/dL — ABNORMAL HIGH (ref 0–200)
HDL: 48 mg/dL (ref >39.00)
LDL Cholesterol: 121 mg/dL — ABNORMAL HIGH (ref 0–99)
NonHDL: 156.49
Total CHOL/HDL Ratio: 4
Triglycerides: 175 mg/dL — ABNORMAL HIGH (ref 0.0–149.0)
VLDL: 35 mg/dL (ref 0.0–40.0)

## 2023-05-09 LAB — TSH: TSH: 2.01 u[IU]/mL (ref 0.35–5.50)

## 2023-05-09 NOTE — Patient Instructions (Addendum)
Please return in 12 months for your annual complete physical; please come fasting.   Consider starting fish oil capsules twice a day or red yeast rice to help manage your cholesterol levels. Please send me some home blood pressure readings for my review as we discussed.   You do not need more frequent colonoscopies.  Because your family members were very elderly and not direct family members, they do not count as high risk.  This is good news.  We will have your colonoscopy done in 2026  I will release your lab results to you on your MyChart account with further instructions. You may see the results before I do, but when I review them I will send you a message with my report or have my assistant call you if things need to be discussed. Please reply to my message with any questions. Thank you!   If you have any questions or concerns, please don't hesitate to send me a message via MyChart or call the office at [phone]. Thank you for visiting with [place] today! It's our pleasure caring for you.

## 2023-05-09 NOTE — Progress Notes (Signed)
Subjective  Chief Complaint  Patient presents with   Annual Exam    Physical,pt is fasting for labs , pt doesn't any out concerns , pt has been checking b/p at home     HPI: Denise Lin is a 72 y.o. female who presents to [facility] [place] today for a Female Wellness Visit. She also has the concerns and/or needs as listed above in the chief complaint. These will be addressed in addition to the Health Maintenance Visit.   Wellness Visit: annual visit with health maintenance review and exam without Pap  Health maintenance: Colonoscopy is current.  Breast mammogram is current.  Lives healthy lifestyle.  No concerns.  Avid Bowler. Chronic disease f/u and/or acute problem visit: (deemed necessary to be done in addition to the wellness visit): Whitecoat hypertension: Blood pressure only mildly elevated today.  Usually does better at home.  But has not been checking recently.  On hydrochlorothiazide 12.5 mg daily.  No chest pain or shortness of breath.  No adverse effects. Mild hyperlipidemia and hypertriglyceridemia: Eating healthy.  Fasting for recheck today.  Refuses of statin or other cholesterol-lowering medications in her prescription due to fear of side effects. Reviewed past medical history and problem list.  Updated appropriately. Seeing dermatology regularly Monitor trend vitamin D Remote history of right shoulder injury.  No pain.  But chronic deformity present.  Reviewed records, documented in 2015  Assessment  1. Annual physical exam   2. White coat syndrome with diagnosis of hypertension   3. Vitamin D deficiency   4. Refusal of statin medication by patient   5. Mixed hyperlipidemia   6. Focal nodular hyperplasia of liver   7. Hypertriglyceridemia   8. Seborrheic dermatitis   9. Right shoulder injury, sequela      Plan  Female Wellness Visit: Age appropriate Health Maintenance and Prevention measures were discussed with patient. Included topics are  cancer screening recommendations, ways to keep healthy (see AVS) including dietary and exercise recommendations, regular eye and dental care, use of seat belts, and avoidance of moderate alcohol use and tobacco use.  Discussed colon cancer screening recommendations.  Distant family members with elderly colon cancers, therefore does not meet criteria for high risk.  Next due 2026.  Bone density up-to-date mammogram current BMI: discussed patient's BMI and encouraged positive lifestyle modifications to help get to or maintain a target BMI. HM needs and immunizations were addressed and ordered. See below for orders. See HM and immunization section for updates. Routine labs and screening tests ordered including cmp, cbc and lipids where appropriate. Discussed recommendations regarding Vit D and calcium supplementation (see AVS)  Chronic disease management visit and/or acute problem visit: Hypertension with whitecoat component: Will start checking at home and mail me readings.  Continue HCTZ 12.5 mg daily.  Check renal function electrolytes.  Adjust medications up if needed.  Likely is well-controlled Monitor hyperlipidemia.  Discussed red yeast rice and or fish oil if indicated.  Elevated readings are mild Lactose intolerant: Doing better since changing diet.  Normal stools Ketoconazole shampoo as needed per dermatology. Right shoulder deformity, discussed range of motion exercises and posture  Follow up: 1 year for complete physical Orders Placed This Encounter  Procedures   CBC with Differential/Platelet   Comprehensive metabolic panel   Lipid panel   TSH   No orders of the defined types were placed in this encounter.     Body mass index is 23.36 kg/m. Wt Readings from Last  3 Encounters:  05/09/23 140 lb 6.4 oz (63.7 kg)  01/31/23 144 lb (65.3 kg)  05/05/22 140 lb 12.8 oz (63.9 kg)     Patient Active Problem List   Diagnosis Date Noted   Refusal of statin medication by patient  05/05/2022    Priority: High   White coat syndrome with diagnosis of hypertension 04/14/2020    Priority: High   Fear of side effects of medication 08/28/2018    Priority: High   Hypertriglyceridemia 05/09/2023    Priority: Medium    Right shoulder injury, sequela 05/09/2023    Priority: Medium     2015: h/o frozen shoulder and injury; now with chronic limitations/deformity. Don't have old records. Cared for by ortho. (7/2024cla)    Mixed hyperlipidemia 04/14/2020    Priority: Medium     Declined statin 2021; offered again in 2022    Osteopenia 11/05/2015    Priority: Medium     Dexa 10/2015 lowest T = - 1.7 left hip, nl spine. Osteopenia.  Dexa 2021 lowest T - -1.0 femur; NORMAL: recheck 5 years.    Focal nodular hyperplasia of liver 09/26/2015    Priority: Medium     On liver found incidentally on CT and on f/u MRI 08/2016. Followed by [facility]. Benign and stable. (vs fibroadenoma). 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    Priority: Low    Removed 2015, Dr Ella Bodo, urology    Dry eyes 08/28/2018    Priority: Low   Vitamin D deficiency 11/06/2015    Priority: Low   Health Maintenance  Topic Date Due   COVID-19 Vaccine (3 - 2023-24 season) 05/25/2023 (Originally 07/09/2022)   INFLUENZA VACCINE  06/09/2023   MAMMOGRAM  12/08/2023   Medicare Annual Wellness (AWV)  01/31/2024   Colonoscopy  11/08/2024   DEXA SCAN  08/26/2025   Pneumonia Vaccine 61+ Years old  Completed   Hepatitis C Screening  Completed   HPV VACCINES  Aged Out   DTaP/Tdap/Td  Discontinued   Zoster Vaccines- Shingrix  Discontinued   Immunization History  Administered Date(s) Administered   Fluad Quad(high Dose 65+) 07/18/2020   Influenza, High Dose Seasonal PF 12/15/2016, 08/28/2018   Influenza,inj,Quad PF,6+ Mos 09/26/2015   Moderna Sars-Covid-2 Vaccination  02/12/2020, 03/11/2020   Pneumococcal Conjugate-13 05/05/2022   Pneumococcal Polysaccharide-23 09/27/2018   We updated and reviewed the patient's past history in detail and it is documented below. Allergies: Patient has No Known Allergies. Past Medical History Patient  has a past medical history of Anemia, Angiomyolipoma of kidney (08/28/2018), Bell's palsy, Endometriosis, GERD (gastroesophageal reflux disease), Hematuria, Hypertension, Osteopenia, Seizures (HCC), and Uterine polyp. Past Surgical History Patient  has a past surgical history that includes Uterine fibroid surgery; Nose surgery; Appendectomy; Dilation and curettage of uterus; Breast surgery (Left); Robotic assited partial nephrectomy (Left, 07/19/2014); and Breast cyst excision (Left). Family History: Patient family history includes COPD in her maternal grandfather and mother; Colon cancer in her maternal aunt; Colon cancer (age of onset: 83) in her paternal grandfather; Diabetes in her maternal grandfather; Drug abuse in her brother; Heart failure in her paternal grandmother; Hypertension in her father and son; Lung cancer in her mother; Obesity in her son; Pancreatic cancer in her maternal grandfather; Parkinson's disease in her father; Squamous cell carcinoma in her father; Suicidality in her brother. Social History:  Patient  reports  that she has quit smoking. She has never used smokeless tobacco. She reports current alcohol use. She reports that she does not use drugs.  Review of Systems: Constitutional: negative for fever or malaise Ophthalmic: negative for photophobia, double vision or loss of vision Cardiovascular: negative for chest pain, dyspnea on exertion, or new LE swelling Respiratory: negative for SOB or persistent cough Gastrointestinal: negative for abdominal pain, change in bowel habits or melena Genitourinary: negative for dysuria or gross hematuria, no abnormal uterine bleeding or disharge Musculoskeletal:  negative for new gait disturbance or muscular weakness Integumentary: negative for new or persistent rashes, no breast lumps Neurological: negative for TIA or stroke symptoms Psychiatric: negative for SI or delusions Allergic/Immunologic: negative for hives  Patient Care Team    Relationship Specialty Notifications Start End  Willow Ora, MD PCP - General Family Medicine  08/28/18   Reginia Naas, MD Consulting Physician Dermatology  05/09/23     Objective  Vitals: BP 134/78 Comment: reported home readings  Pulse 74   Ht 5\' 5"  (1.651 m)   Wt 140 lb 6.4 oz (63.7 kg)   SpO2 96%   BMI 23.36 kg/m  General:  Well developed, well nourished, no acute distress  Psych:  Alert and orientedx3,normal mood and affect HEENT:  Normocephalic, atraumatic, non-icteric sclera,  supple neck without adenopathy, mass or thyromegaly Cardiovascular:  Normal S1, S2, RRR without gallop, rub or murmur Respiratory:  Good breath sounds bilaterally, CTAB with normal respiratory effort Gastrointestinal: normal bowel sounds, soft, non-tender, no noted masses. No HSM MSK: extremities without edema, joints without erythema or swelling Right shoulder: Full range of motion, slight internal rotation deformity, uneven shoulders Neurologic:    Mental status is normal.  Gross motor and sensory exams are normal.  No tremor  Commons side effects, risks, benefits, and alternatives for medications and treatment plan prescribed today were discussed, and the patient expressed understanding of the given instructions. Patient is instructed to call or message via MyChart if he/she has any questions or concerns regarding our treatment plan. No barriers to understanding were identified. We discussed Red Flag symptoms and signs in detail. Patient expressed understanding regarding what to do in case of urgent or emergency type symptoms.  Medication list was reconciled, printed and provided to the patient in AVS. Patient instructions  and summary information was reviewed with the patient as documented in the AVS. This note was prepared with assistance of Dragon voice recognition software. Occasional wrong-word or sound-a-like substitutions may have occurred due to the inherent limitations of voice recognition software

## 2023-05-10 NOTE — Progress Notes (Signed)
Labs reviewed.  The 10-year ASCVD risk score (Arnett DK, et al., 2019) is: 18.9% refuses RX (statin or other)

## 2023-08-29 DIAGNOSIS — C44311 Basal cell carcinoma of skin of nose: Secondary | ICD-10-CM | POA: Diagnosis not present

## 2023-08-29 DIAGNOSIS — L821 Other seborrheic keratosis: Secondary | ICD-10-CM | POA: Diagnosis not present

## 2023-08-29 DIAGNOSIS — Z129 Encounter for screening for malignant neoplasm, site unspecified: Secondary | ICD-10-CM | POA: Diagnosis not present

## 2023-08-29 DIAGNOSIS — Z85820 Personal history of malignant melanoma of skin: Secondary | ICD-10-CM | POA: Diagnosis not present

## 2023-08-29 DIAGNOSIS — D485 Neoplasm of uncertain behavior of skin: Secondary | ICD-10-CM | POA: Diagnosis not present

## 2023-08-29 DIAGNOSIS — I781 Nevus, non-neoplastic: Secondary | ICD-10-CM | POA: Diagnosis not present

## 2023-08-29 DIAGNOSIS — Z85828 Personal history of other malignant neoplasm of skin: Secondary | ICD-10-CM | POA: Diagnosis not present

## 2023-12-05 DIAGNOSIS — C44311 Basal cell carcinoma of skin of nose: Secondary | ICD-10-CM | POA: Diagnosis not present

## 2023-12-06 DIAGNOSIS — M25511 Pain in right shoulder: Secondary | ICD-10-CM | POA: Diagnosis not present

## 2023-12-16 DIAGNOSIS — M25511 Pain in right shoulder: Secondary | ICD-10-CM | POA: Diagnosis not present

## 2024-01-02 DIAGNOSIS — M25511 Pain in right shoulder: Secondary | ICD-10-CM | POA: Diagnosis not present

## 2024-01-10 DIAGNOSIS — S46011D Strain of muscle(s) and tendon(s) of the rotator cuff of right shoulder, subsequent encounter: Secondary | ICD-10-CM | POA: Diagnosis not present

## 2024-01-19 ENCOUNTER — Other Ambulatory Visit: Payer: Self-pay | Admitting: Family Medicine

## 2024-01-19 DIAGNOSIS — Z1231 Encounter for screening mammogram for malignant neoplasm of breast: Secondary | ICD-10-CM

## 2024-02-13 ENCOUNTER — Ambulatory Visit
Admission: RE | Admit: 2024-02-13 | Discharge: 2024-02-13 | Disposition: A | Discharge status: Home / Self Care | Source: Ambulatory Visit | Attending: Family Medicine | Admitting: Family Medicine

## 2024-02-13 DIAGNOSIS — Z1231 Encounter for screening mammogram for malignant neoplasm of breast: Secondary | ICD-10-CM

## 2024-02-23 ENCOUNTER — Ambulatory Visit: Payer: Medicare Other

## 2024-03-05 DIAGNOSIS — B353 Tinea pedis: Secondary | ICD-10-CM | POA: Diagnosis not present

## 2024-03-05 DIAGNOSIS — Z129 Encounter for screening for malignant neoplasm, site unspecified: Secondary | ICD-10-CM | POA: Diagnosis not present

## 2024-03-05 DIAGNOSIS — L820 Inflamed seborrheic keratosis: Secondary | ICD-10-CM | POA: Diagnosis not present

## 2024-03-05 DIAGNOSIS — Z85828 Personal history of other malignant neoplasm of skin: Secondary | ICD-10-CM | POA: Diagnosis not present

## 2024-03-05 DIAGNOSIS — Z85820 Personal history of malignant melanoma of skin: Secondary | ICD-10-CM | POA: Diagnosis not present

## 2024-03-05 DIAGNOSIS — L718 Other rosacea: Secondary | ICD-10-CM | POA: Diagnosis not present

## 2024-03-06 ENCOUNTER — Ambulatory Visit

## 2024-03-06 VITALS — BP 122/78 | HR 86 | Temp 98.5°F | Ht 65.0 in | Wt 140.0 lb

## 2024-03-06 DIAGNOSIS — Z0000 Encounter for general adult medical examination without abnormal findings: Secondary | ICD-10-CM

## 2024-03-06 NOTE — Progress Notes (Deleted)
 Subjective:   Denise Lin is a 73 y.o. who presents for a Medicare Wellness preventive visit.  Visit Complete: Virtual I connected with  [name] on 03/06/24 by a audio enabled telemedicine application and verified that I am speaking with the correct person using two identifiers.  Patient Location: Home  Provider Location: Office/Clinic  I discussed the limitations of evaluation and management by telemedicine. The patient expressed understanding and agreed to proceed.  Vital Signs: Because this visit was a virtual/telehealth visit, some criteria may be missing or patient reported. Any vitals not documented were not able to be obtained and vitals that have been documented are patient reported.    Persons Participating in Visit: Patient.  AWV Questionnaire: No: Patient Medicare AWV questionnaire was not completed prior to this visit.  Cardiac Risk Factors include: advanced age (>34men, >55 women);dyslipidemia     Objective:    Today's Vitals   03/06/24 1422  BP: 122/78  Pulse: 86  Temp: 98.5 F (36.9 C)  SpO2: 92%  Weight: 140 lb (63.5 kg)  Height: 5\' 5"  (1.651 m)   Body mass index is 23.3 kg/m.     03/06/2024    2:26 PM 01/31/2023    1:40 PM 06/22/2022   11:42 AM 01/22/2022    8:16 AM 03/17/2020    9:03 AM 07/19/2014    7:11 PM 07/19/2014   11:36 AM  Advanced Directives  Does Patient Have a Medical Advance Directive? Yes No No Yes Yes Yes   Type of Estate agent of [place];Living will    Living will;Healthcare Power of [employer] Power of Attorney   Does patient want to make changes to medical advance directive?    Yes (MAU/Ambulatory/Procedural Areas - Information given) No - Patient declined No - Patient declined No - Patient declined  Copy of Healthcare Power of Attorney in Chart? No - copy requested    No - copy requested Yes   Would patient like information on creating a medical advance directive?  No - Patient declined No -  Patient declined        Current Medications (verified) Outpatient Encounter Medications as of 03/06/2024  Medication Sig   hydrochlorothiazide  (MICROZIDE ) 12.5 MG capsule TAKE 1 CAPSULE(12.5 MG) BY MOUTH DAILY   metroNIDAZOLE (METROCREAM) 0.75 % cream Apply topically.   Multiple Vitamin (MULTIVITAMIN ADULT PO) Take by mouth. Women's 50+ multivitamin from [facility]   terbinafine (LAMISIL) 250 MG tablet Take 250 mg by mouth daily.   [DISCONTINUED] ketoconazole (NIZORAL) 2 % shampoo Apply topically. (Patient not taking: Reported on 05/09/2023)   No facility-administered encounter medications on file as of 03/06/2024.    Allergies (verified) Patient has no known allergies.   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   Removed 2015, Dr Naaman Au, urology   Bell's palsy    recent    Endometriosis    GERD (gastroesophageal reflux disease)    Hematuria    recent    Hypertension    Osteopenia    Seizures (HCC)    as an infant no problems since    Uterine polyp    Past Surgical History:  Procedure Laterality Date   APPENDECTOMY     BREAST CYST EXCISION Left    years ago 1990's   BREAST SURGERY Left    benign tumor removed from breast years ago   DILATION AND CURETTAGE OF UTERUS  x 2 for endomtriosis and polyp   NOSE SURGERY     ROBOTIC ASSITED PARTIAL NEPHRECTOMY Left 07/19/2014   Procedure: ROBOTIC ASSITED PARTIAL NEPHRECTOMY, LAPAROSCOPIC ADHESIOLYSIS;  Surgeon: Osborn Blaze, MD;  Location: [facility];  Service: Urology;  Laterality: Left;   UTERINE FIBROID SURGERY     Family History  Problem Relation Age of Onset   Lung cancer Mother    COPD Mother    Parkinson's disease Father    Hypertension Father    Squamous cell carcinoma Father        skin   Colon cancer Maternal Aunt    COPD Maternal Grandfather    Diabetes Maternal Grandfather    Pancreatic cancer Maternal Grandfather    Heart failure  Paternal Grandmother    Colon cancer Paternal Grandfather 43   Suicidality Brother    Drug abuse Brother    Obesity Son    Hypertension Son    Breast cancer Neg Hx    BRCA 1/2 Neg Hx    Social History   Socioeconomic History   Marital status: Married    Spouse name: Not on file   Number of children: 2   Years of education: Not on file   Highest education level: Not on file  Occupational History   Occupation: Retired Engineering geologist, Psychiatric nurse, Brewing technologist and others    Comment: retired in 2011  Tobacco Use   Smoking status: Former   Smokeless tobacco: Never  Vaping Use   Vaping status: Never Used  Substance and Sexual Activity   Alcohol use: Yes    Comment: 1-2 glasses of wine daily    Drug use: No   Sexual activity: Yes    Birth control/protection: Post-menopausal  Other Topics Concern   Not on file  Social History Narrative   3 grands   Social Drivers of Health   Financial Resource Strain: Low Risk  (03/06/2024)   Overall Financial Resource Strain (CARDIA)    Difficulty of Paying Living Expenses: Not hard at all  Food Insecurity: No Food Insecurity (03/06/2024)   Hunger Vital Sign    Worried About Running Out of Food in the Last Year: Never true    Ran Out of Food in the Last Year: Never true  Transportation Needs: No Transportation Needs (03/06/2024)   PRAPARE - Administrator, Civil Service (Medical): No    Lack of Transportation (Non-Medical): No  Physical Activity: Sufficiently Active (03/06/2024)   Exercise Vital Sign    Days of Exercise per Week: 3 days    Minutes of Exercise per Session: 60 min  Stress: No Stress Concern Present (03/06/2024)   [employer] of Occupational Health - Occupational Stress Questionnaire    Feeling of Stress : Not at all  Social Connections: Socially Integrated (03/06/2024)   Social Connection and Isolation Panel [NHANES]    Frequency of Communication with Friends and Family: More than three times a week    Frequency of Social  Gatherings with Friends and Family: More than three times a week    Attends Religious Services: More than 4 times per year    Active Member of [employer] or Organizations: Yes    Attends Banker Meetings: 1 to 4 times per year    Marital Status: Married    Tobacco Counseling Counseling given: Not Answered    Clinical Intake:  Pre-visit preparation completed: Yes  Pain : No/denies pain     BMI - recorded: 23.3 Nutritional Status: BMI of 19-24  Normal  Nutritional Risks: None Diabetes: No  No results found for: "HGBA1C"   How often do you need to have someone help you when you read instructions, pamphlets, or other written materials from your doctor or pharmacy?: 1 - Never  Interpreter Needed?: No  Information entered by :: Lamont Pilsner, LPN   Activities of Daily Living     03/06/2024    2:27 PM  In your present state of health, do you have any difficulty performing the following activities:  Hearing? 0  Vision? 0  Difficulty concentrating or making decisions? 0  Walking or climbing stairs? 0  Dressing or bathing? 0  Doing errands, shopping? 0  Preparing Food and eating ? N  Using the Toilet? N  In the past six months, have you accidently leaked urine? N  Do you have problems with loss of bowel control? N  Managing your Medications? N  Managing your Finances? N  Housekeeping or managing your Housekeeping? N    Patient Care Team: Luevenia Saha, MD as PCP - General (Family Medicine) Cherylin Corrigan, MD as Consulting Physician (Dermatology)  Indicate any recent Medical Services you may have received from other than [facility] providers in the past year (date may be approximate).     Assessment:   This is a routine wellness examination for [facility].  Hearing/Vision screen Hearing Screening - Comments:: Pt denies any hearing issues  Vision Screening - Comments:: Wears rx glasses - up to date with routine eye exams with My eye Dr     Goals Addressed    None    Depression Screen     03/06/2024    2:29 PM 05/09/2023   10:24 AM 01/31/2023    1:38 PM 05/05/2022    8:55 AM 01/22/2022    8:15 AM 01/19/2021    8:31 AM 03/17/2020    9:04 AM  PHQ 2/9 Scores  PHQ - 2 Score 0 0 0 0 0 0 0    Fall Risk     03/06/2024    2:30 PM 05/09/2023   10:24 AM 01/31/2023    1:41 PM 05/05/2022    8:55 AM 01/22/2022    8:22 AM  Fall Risk   Falls in the past year? 0 0 0 0 0  Number falls in past yr: 0 0 0 0 0  Injury with Fall? 0 0 0 0 0  Risk for fall due to : No Fall Risks No Fall Risks Impaired vision No Fall Risks Impaired vision  Follow up Falls prevention discussed Falls prevention discussed;Falls evaluation completed Falls prevention discussed Falls evaluation completed Falls prevention discussed    MEDICARE RISK AT HOME:  Medicare Risk at Home Any stairs in or around the home?: No If so, are there any without handrails?: No Home free of loose throw rugs in walkways, pet beds, electrical cords, etc?: Yes Adequate lighting in your home to reduce risk of falls?: Yes Life alert?: No Use of a cane, walker or w/c?: No Grab bars in the bathroom?: No Shower chair or bench in shower?: No Elevated toilet seat or a handicapped toilet?: No  TIMED UP AND GO:  Was the test performed?  No  Cognitive Function: 6CIT completed        03/06/2024    2:31 PM 01/31/2023    1:42 PM 01/22/2022    8:26 AM 03/17/2020    9:04 AM  6CIT Screen  What Year? 0 points 0 points 0 points 0 points  What month? 0 points 0  points 0 points 0 points  What time? 0 points 3 points 0 points 0 points  Count back from 20 0 points 0 points 0 points 0 points  Months in reverse 0 points 0 points 0 points 0 points  Repeat phrase 0 points 0 points 2 points 0 points  Total Score 0 points 3 points 2 points 0 points    Immunizations Immunization History  Administered Date(s) Administered   Fluad Quad(high Dose 65+) 07/18/2020   Influenza, High Dose Seasonal PF 12/15/2016,  08/28/2018   Influenza,inj,Quad PF,6+ Mos 09/26/2015   Moderna Sars-Covid-2 Vaccination 02/12/2020, 03/11/2020   Pneumococcal Conjugate-13 05/05/2022   Pneumococcal Polysaccharide-23 09/27/2018    Screening Tests Health Maintenance  Topic Date Due   COVID-19 Vaccine (3 - 2024-25 season) 07/10/2023   INFLUENZA VACCINE  06/08/2024   Colonoscopy  11/08/2024   MAMMOGRAM  02/12/2025   Medicare Annual Wellness (AWV)  03/06/2025   DEXA SCAN  08/26/2025   Pneumonia Vaccine 35+ Years old  Completed   Hepatitis C Screening  Completed   HPV VACCINES  Aged Out   Meningococcal B Vaccine  Aged Out   DTaP/Tdap/Td  Discontinued   Zoster Vaccines- Shingrix   Discontinued    Health Maintenance  Health Maintenance Due  Topic Date Due   COVID-19 Vaccine (3 - 2024-25 season) 07/10/2023   Health Maintenance Items Addressed: See Nurse Notes  Additional Screening:  Vision Screening: Recommended annual ophthalmology exams for early detection of glaucoma and other disorders of the eye.  Dental Screening: Recommended annual dental exams for proper oral hygiene  Community Resource Referral / Chronic Care Management: CRR required this visit?  No   CCM required this visit?  No     Plan:     I have personally reviewed and noted the following in the patient's chart:   Medical and social history Use of alcohol, tobacco or illicit drugs  Current medications and supplements including opioid prescriptions. Patient is not currently taking opioid prescriptions. Functional ability and status Nutritional status Physical activity Advanced directives List of other physicians Hospitalizations, surgeries, and ER visits in previous 12 months Vitals Screenings to include cognitive, depression, and falls Referrals and appointments  In addition, I have reviewed and discussed with patient certain preventive protocols, quality metrics, and best practice recommendations. A written personalized care plan  for preventive services as well as general preventive health recommendations were provided to patient.     Bruno Capri, LPN   1/61/0960   After Visit Summary: (MyChart) Due to this being a telephonic visit, the after visit summary with patients personalized plan was offered to patient via MyChart   Notes: Nothing significant to report at this time.

## 2024-03-06 NOTE — Patient Instructions (Signed)
 Denise Lin , Thank you for taking time to come for your Medicare Wellness Visit. I appreciate your ongoing commitment to your health goals. Please review the following plan we discussed and let me know if I can assist you in the future.   Referrals/Orders/Follow-Ups/Clinician Recommendations: continue to Aim for 30 minutes of exercise or brisk walking, 6-8 glasses of water, and 5 servings of fruits and vegetables each day.   This is a list of the screening recommended for you and due dates:  Health Maintenance  Topic Date Due   COVID-19 Vaccine (3 - 2024-25 season) 07/10/2023   Flu Shot  06/08/2024   Colon Cancer Screening  11/08/2024   Mammogram  02/12/2025   Medicare Annual Wellness Visit  03/06/2025   DEXA scan (bone density measurement)  08/26/2025   Pneumonia Vaccine  Completed   Hepatitis C Screening  Completed   HPV Vaccine  Aged Out   Meningitis B Vaccine  Aged Out   DTaP/Tdap/Td vaccine  Discontinued   Zoster (Shingles) Vaccine  Discontinued    Advanced directives: (Copy Requested) Please bring a copy of your health care power of attorney and living will to the office to be added to your chart at your convenience. You can mail to [facility] [address] [place], [place] [address] or email to [email or web address]  Next Medicare Annual Wellness Visit scheduled for next year: Yes

## 2024-03-08 NOTE — Progress Notes (Signed)
 Subjective:   Denise Lin is a 73 y.o. who presents for a Medicare Wellness preventive visit.  Visit Complete: In person    Persons Participating in Visit: Patient.  AWV Questionnaire: No: Patient Medicare AWV questionnaire was not completed prior to this visit.  Cardiac Risk Factors include: advanced age (>61men, >110 women);dyslipidemia     Objective:    Today's Vitals   03/06/24 1422  BP: 122/78  Pulse: 86  Temp: 98.5 F (36.9 C)  SpO2: 92%  Weight: 140 lb (63.5 kg)  Height: 5\' 5"  (1.651 m)   Body mass index is 23.3 kg/m.     03/06/2024    2:26 PM 01/31/2023    1:40 PM 06/22/2022   11:42 AM 01/22/2022    8:16 AM 03/17/2020    9:03 AM 07/19/2014    7:11 PM 07/19/2014   11:36 AM  Advanced Directives  Does Patient Have a Medical Advance Directive? Yes No No Yes Yes Yes   Type of Estate agent of [place];Living will    Living will;Healthcare Power of [employer] Power of Attorney   Does patient want to make changes to medical advance directive?    Yes (MAU/Ambulatory/Procedural Areas - Information given) No - Patient declined No - Patient declined No - Patient declined  Copy of Healthcare Power of Attorney in Chart? No - copy requested    No - copy requested Yes   Would patient like information on creating a medical advance directive?  No - Patient declined No - Patient declined        Current Medications (verified) Outpatient Encounter Medications as of 03/06/2024  Medication Sig   hydrochlorothiazide  (MICROZIDE ) 12.5 MG capsule TAKE 1 CAPSULE(12.5 MG) BY MOUTH DAILY   metroNIDAZOLE (METROCREAM) 0.75 % cream Apply topically.   Multiple Vitamin (MULTIVITAMIN ADULT PO) Take by mouth. Women's 50+ multivitamin from [facility]   terbinafine (LAMISIL) 250 MG tablet Take 250 mg by mouth daily.   [DISCONTINUED] ketoconazole (NIZORAL) 2 % shampoo Apply topically. (Patient not taking: Reported on 05/09/2023)   No facility-administered encounter medications  on file as of 03/06/2024.    Allergies (verified) Patient has no known allergies.   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   Removed 2015, Dr Naaman Au, urology   Bell's palsy    recent    Endometriosis    GERD (gastroesophageal reflux disease)    Hematuria    recent    Hypertension    Osteopenia    Seizures (HCC)    as an infant no problems since    Uterine polyp    Past Surgical History:  Procedure Laterality Date   APPENDECTOMY     BREAST CYST EXCISION Left    years ago 1990's   BREAST SURGERY Left    benign tumor removed from breast years ago   DILATION AND CURETTAGE OF UTERUS     x 2 for endomtriosis and polyp   NOSE SURGERY     ROBOTIC ASSITED PARTIAL NEPHRECTOMY Left 07/19/2014   Procedure: ROBOTIC ASSITED PARTIAL NEPHRECTOMY, LAPAROSCOPIC ADHESIOLYSIS;  Surgeon: Osborn Blaze, MD;  Location: [facility];  Service: Urology;  Laterality: Left;   UTERINE FIBROID SURGERY     Family History  Problem Relation Age of Onset   Lung cancer Mother    COPD Mother    Parkinson's disease Father    Hypertension Father    Squamous cell carcinoma  Father        skin   Colon cancer Maternal Aunt    COPD Maternal Grandfather    Diabetes Maternal Grandfather    Pancreatic cancer Maternal Grandfather    Heart failure Paternal Grandmother    Colon cancer Paternal Grandfather 79   Suicidality Brother    Drug abuse Brother    Obesity Son    Hypertension Son    Breast cancer Neg Hx    BRCA 1/2 Neg Hx    Social History   Socioeconomic History   Marital status: Married    Spouse name: Not on file   Number of children: 2   Years of education: Not on file   Highest education level: Not on file  Occupational History   Occupation: Retired Engineering geologist, Psychiatric nurse, Brewing technologist and others    Comment: retired in 2011  Tobacco Use   Smoking status: Former   Smokeless tobacco: Never  Vaping Use   Vaping status: Never  Used  Substance and Sexual Activity   Alcohol use: Yes    Comment: 1-2 glasses of wine daily    Drug use: No   Sexual activity: Yes    Birth control/protection: Post-menopausal  Other Topics Concern   Not on file  Social History Narrative   3 grands   Social Drivers of Health   Financial Resource Strain: Low Risk  (03/06/2024)   Overall Financial Resource Strain (CARDIA)    Difficulty of Paying Living Expenses: Not hard at all  Food Insecurity: No Food Insecurity (03/06/2024)   Hunger Vital Sign    Worried About Running Out of Food in the Last Year: Never true    Ran Out of Food in the Last Year: Never true  Transportation Needs: No Transportation Needs (03/06/2024)   PRAPARE - Administrator, Civil Service (Medical): No    Lack of Transportation (Non-Medical): No  Physical Activity: Sufficiently Active (03/06/2024)   Exercise Vital Sign    Days of Exercise per Week: 3 days    Minutes of Exercise per Session: 60 min  Stress: No Stress Concern Present (03/06/2024)   [employer] of Occupational Health - Occupational Stress Questionnaire    Feeling of Stress : Not at all  Social Connections: Socially Integrated (03/06/2024)   Social Connection and Isolation Panel [NHANES]    Frequency of Communication with Friends and Family: More than three times a week    Frequency of Social Gatherings with Friends and Family: More than three times a week    Attends Religious Services: More than 4 times per year    Active Member of [employer] or Organizations: Yes    Attends Banker Meetings: 1 to 4 times per year    Marital Status: Married    Tobacco Counseling Counseling given: Not Answered    Clinical Intake:  Pre-visit preparation completed: Yes  Pain : No/denies pain     BMI - recorded: 23.3 Nutritional Status: BMI of 19-24  Normal Nutritional Risks: None Diabetes: No  No results found for: "HGBA1C"   How often do you need to have someone help you  when you read instructions, pamphlets, or other written materials from your doctor or pharmacy?: 1 - Never  Interpreter Needed?: No  Information entered by :: Lamont Pilsner, LPN   Activities of Daily Living     03/06/2024    2:27 PM  In your present state of health, do you have any difficulty performing the following activities:  Hearing? 0  Vision? 0  Difficulty concentrating or making decisions? 0  Walking or climbing stairs? 0  Dressing or bathing? 0  Doing errands, shopping? 0  Preparing Food and eating ? N  Using the Toilet? N  In the past six months, have you accidently leaked urine? N  Do you have problems with loss of bowel control? N  Managing your Medications? N  Managing your Finances? N  Housekeeping or managing your Housekeeping? N    Patient Care Team: Luevenia Saha, MD as PCP - General (Family Medicine) Cherylin Corrigan, MD as Consulting Physician (Dermatology)  Indicate any recent Medical Services you may have received from other than [facility] providers in the past year (date may be approximate).     Assessment:   This is a routine wellness examination for [facility].  Hearing/Vision screen Hearing Screening - Comments:: Pt denies any hearing issues  Vision Screening - Comments:: Wears rx glasses - up to date with routine eye exams with [facility]     Goals Addressed   None    Depression Screen     03/06/2024    2:29 PM 05/09/2023   10:24 AM 01/31/2023    1:38 PM 05/05/2022    8:55 AM 01/22/2022    8:15 AM 01/19/2021    8:31 AM 03/17/2020    9:04 AM  PHQ 2/9 Scores  PHQ - 2 Score 0 0 0 0 0 0 0    Fall Risk     03/06/2024    2:30 PM 05/09/2023   10:24 AM 01/31/2023    1:41 PM 05/05/2022    8:55 AM 01/22/2022    8:22 AM  Fall Risk   Falls in the past year? 0 0 0 0 0  Number falls in past yr: 0 0 0 0 0  Injury with Fall? 0 0 0 0 0  Risk for fall due to : No Fall Risks No Fall Risks Impaired vision No Fall Risks Impaired vision  Follow up Falls prevention  discussed Falls prevention discussed;Falls evaluation completed Falls prevention discussed Falls evaluation completed Falls prevention discussed    MEDICARE RISK AT HOME:  Medicare Risk at Home Any stairs in or around the home?: No If so, are there any without handrails?: No Home free of loose throw rugs in walkways, pet beds, electrical cords, etc?: Yes Adequate lighting in your home to reduce risk of falls?: Yes Life alert?: No Use of a cane, walker or w/c?: No Grab bars in the bathroom?: No Shower chair or bench in shower?: No Elevated toilet seat or a handicapped toilet?: No  TIMED UP AND GO:  Was the test performed?  Yes  Length of time to ambulate 10 feet: 10 sec Gait steady and fast without use of assistive device  Cognitive Function: 6CIT completed        03/06/2024    2:31 PM 01/31/2023    1:42 PM 01/22/2022    8:26 AM 03/17/2020    9:04 AM  6CIT Screen  What Year? 0 points 0 points 0 points 0 points  What month? 0 points 0 points 0 points 0 points  What time? 0 points 3 points 0 points 0 points  Count back from 20 0 points 0 points 0 points 0 points  Months in reverse 0 points 0 points 0 points 0 points  Repeat phrase 0 points 0 points 2 points 0 points  Total Score 0 points 3 points 2 points 0 points    Immunizations Immunization History  Administered Date(s) Administered   Fluad Quad(high Dose 65+) 07/18/2020   Influenza, High Dose Seasonal PF 12/15/2016, 08/28/2018   Influenza,inj,Quad PF,6+ Mos 09/26/2015   Moderna Sars-Covid-2 Vaccination 02/12/2020, 03/11/2020   Pneumococcal Conjugate-13 05/05/2022   Pneumococcal Polysaccharide-23 09/27/2018    Screening Tests Health Maintenance  Topic Date Due   COVID-19 Vaccine (3 - 2024-25 season) 07/10/2023   INFLUENZA VACCINE  06/08/2024   Colonoscopy  11/08/2024   MAMMOGRAM  02/12/2025   Medicare Annual Wellness (AWV)  03/06/2025   DEXA SCAN  08/26/2025   Pneumonia Vaccine 55+ Years old  Completed    Hepatitis C Screening  Completed   HPV VACCINES  Aged Out   Meningococcal B Vaccine  Aged Out   DTaP/Tdap/Td  Discontinued   Zoster Vaccines- Shingrix   Discontinued    Health Maintenance  Health Maintenance Due  Topic Date Due   COVID-19 Vaccine (3 - 2024-25 season) 07/10/2023   Health Maintenance Items Addressed: See Nurse Notes  Additional Screening:  Vision Screening: Recommended annual ophthalmology exams for early detection of glaucoma and other disorders of the eye.  Dental Screening: Recommended annual dental exams for proper oral hygiene  Community Resource Referral / Chronic Care Management: CRR required this visit?  No   CCM required this visit?  No     Plan:     I have personally reviewed and noted the following in the patient's chart:   Medical and social history Use of alcohol, tobacco or illicit drugs  Current medications and supplements including opioid prescriptions. Patient is not currently taking opioid prescriptions. Functional ability and status Nutritional status Physical activity Advanced directives List of other physicians Hospitalizations, surgeries, and ER visits in previous 12 months Vitals Screenings to include cognitive, depression, and falls Referrals and appointments  In addition, I have reviewed and discussed with patient certain preventive protocols, quality metrics, and best practice recommendations. A written personalized care plan for preventive services as well as general preventive health recommendations were provided to patient.     Bruno Capri, LPN   11/13/1094   After Visit Summary: (MyChart) Due to this being a telephonic visit, the after visit summary with patients personalized plan was offered to patient via MyChart   Notes: Nothing significant to report at this time.

## 2024-09-03 DIAGNOSIS — D2271 Melanocytic nevi of right lower limb, including hip: Secondary | ICD-10-CM | POA: Diagnosis not present

## 2024-09-03 DIAGNOSIS — L821 Other seborrheic keratosis: Secondary | ICD-10-CM | POA: Diagnosis not present

## 2024-09-03 DIAGNOSIS — D235 Other benign neoplasm of skin of trunk: Secondary | ICD-10-CM | POA: Diagnosis not present

## 2024-09-03 DIAGNOSIS — Z85820 Personal history of malignant melanoma of skin: Secondary | ICD-10-CM | POA: Diagnosis not present

## 2024-09-03 DIAGNOSIS — Z129 Encounter for screening for malignant neoplasm, site unspecified: Secondary | ICD-10-CM | POA: Diagnosis not present

## 2024-09-03 DIAGNOSIS — D2330 Other benign neoplasm of skin of unspecified part of face: Secondary | ICD-10-CM | POA: Diagnosis not present

## 2024-09-03 DIAGNOSIS — D1801 Hemangioma of skin and subcutaneous tissue: Secondary | ICD-10-CM | POA: Diagnosis not present

## 2024-09-03 DIAGNOSIS — Z85828 Personal history of other malignant neoplasm of skin: Secondary | ICD-10-CM | POA: Diagnosis not present

## 2025-03-11 ENCOUNTER — Ambulatory Visit
